# Patient Record
Sex: Female | Born: 1955 | Race: White | Hispanic: No | Smoking: Never smoker
Health system: Southern US, Community
[De-identification: ages and names within clinical notes are randomized; demographics above are authoritative.]

## PROBLEM LIST (undated history)

## (undated) DIAGNOSIS — J45909 Unspecified asthma, uncomplicated: Secondary | ICD-10-CM

## (undated) DIAGNOSIS — K219 Gastro-esophageal reflux disease without esophagitis: Secondary | ICD-10-CM

## (undated) DIAGNOSIS — Z8489 Family history of other specified conditions: Secondary | ICD-10-CM

## (undated) DIAGNOSIS — I499 Cardiac arrhythmia, unspecified: Secondary | ICD-10-CM

## (undated) DIAGNOSIS — J302 Other seasonal allergic rhinitis: Secondary | ICD-10-CM

## (undated) DIAGNOSIS — M1990 Unspecified osteoarthritis, unspecified site: Secondary | ICD-10-CM

## (undated) DIAGNOSIS — R011 Cardiac murmur, unspecified: Secondary | ICD-10-CM

## (undated) DIAGNOSIS — E041 Nontoxic single thyroid nodule: Secondary | ICD-10-CM

## (undated) DIAGNOSIS — R202 Paresthesia of skin: Secondary | ICD-10-CM

## (undated) DIAGNOSIS — I10 Essential (primary) hypertension: Secondary | ICD-10-CM

## (undated) HISTORY — PX: MOUTH SURGERY: SHX715

## (undated) HISTORY — PX: TONSILLECTOMY: SUR1361

## (undated) HISTORY — PX: COLONOSCOPY: SHX174

## (undated) HISTORY — PX: PARATHYROIDECTOMY: SHX19

---

## 2011-05-10 DIAGNOSIS — M189 Osteoarthritis of first carpometacarpal joint, unspecified: Secondary | ICD-10-CM | POA: Insufficient documentation

## 2013-03-12 DIAGNOSIS — M170 Bilateral primary osteoarthritis of knee: Secondary | ICD-10-CM | POA: Insufficient documentation

## 2013-05-15 DIAGNOSIS — M222X9 Patellofemoral disorders, unspecified knee: Secondary | ICD-10-CM | POA: Insufficient documentation

## 2014-05-27 DIAGNOSIS — E041 Nontoxic single thyroid nodule: Secondary | ICD-10-CM | POA: Insufficient documentation

## 2018-09-18 ENCOUNTER — Inpatient Hospital Stay: Admission: RE | Admit: 2018-09-18 | Payer: Self-pay | Source: Ambulatory Visit

## 2018-09-23 ENCOUNTER — Other Ambulatory Visit: Payer: Self-pay

## 2018-09-23 NOTE — H&P (Signed)
Barbara Harvey is a 63 y.o. female hereFx D+C , H/S and myosure for PMB and a endometrial polyp  embx shows proliferative , no atypia , no cancer   Pap - negative    Past Medical History:  has a past medical history of Abnormal Pap smear (2005), Acne (1970?), Actinic keratosis (early 1990s), Allergic rhinitis, seasonal (03/08/2011), Allergic state (?), Anxiety, Arthritis (?), Atypical nevus (early 1990s), Dermatitis (March 2017), Environmental allergies (2000), Parathyroid disorder (CMS-HCC) (2018), Right thyroid nodule (05/27/2014), Urticaria, chronic (1977), and Varicella (~ age 31).  Past Surgical History:  has a past surgical history that includes Tonsillectomy; oral cyst (Right); surveillance colonoscopy (N/A, 06/24/2012); parathyroidectomy (N/A, 05/09/2016); monitoring cranial nerves unilateral (N/A, 05/09/2016); Colonoscopy (06/24/2012, 01/20/2007); Colonoscopy (09/02/2017); and Skin biopsy (?early 1990s). Family History: family history includes Basal cell carcinoma in her father and mother; Brain hemorrhage in her paternal grandmother; Breast cancer (age of onset: 67) in her paternal grandmother; Cancer in her father, maternal aunt, maternal grandfather, maternal grandmother, mother, and paternal grandmother; Colon cancer in her maternal grandfather; Colon polyps in her maternal uncle; Coronary Artery Disease (Blocked arteries around heart) in her paternal grandfather; Diabetes in her father; Diabetes type II in her father; High blood pressure (Hypertension) in her father, mother, and paternal grandmother; High blood pressure (Hypertension) (age of onset: 7) in her paternal grandfather; Hyperlipidemia (Elevated cholesterol) in her father and mother; Lymphoma in her maternal grandmother; Melanoma in her maternal aunt; Multiple myeloma in her maternal grandfather; Nephrolithiasis in her father; Obesity in her maternal grandmother and paternal grandmother; Osteoarthritis in her father, paternal grandfather, and  paternal grandmother; Other in her father; Prostate cancer in her father; Rashes / Skin problems in her father and mother; Skin cancer in her father, maternal aunt, and mother; Skin cancer, non-melanoma in her father and mother; Stroke in her paternal grandfather; Stroke (age of onset: 60) in her mother. Social History:  reports that she has never smoked. She has never used smokeless tobacco. She reports previous alcohol use. She reports that she does not use drugs. OB/GYN History:          OB History    Gravida  2   Para  2   Term  2   Preterm      AB      Living  2     SAB      TAB      Ectopic      Molar      Multiple      Live Births  2          Allergies: is allergic to pineapple. Medications:  Current Outpatient Medications:  .  acetaminophen (TYLENOL) 500 MG tablet, Take 1,000 mg by mouth nightly  , Disp: , Rfl:  .  calcium citrate-vitamin D3 (CITRACAL+D) 315-200 mg-unit tablet, Take 1 tablet by mouth 2 (two) times daily with meals.  , Disp: , Rfl:  .  fexofenadine (ALLEGRA) 180 MG tablet, 1 tab by mouth daily, Disp: , Rfl: 1 .  fluticasone propionate (FLONASE) 50 mcg/actuation nasal spray, Place 2 sprays into both nostrils once daily, Disp: 48 g, Rfl: 3 .  magnesium oxide (PHILLIPS) 500 mg Tab, Take by mouth 2 (two) times daily, Disp: , Rfl:   Review of Systems: General:                      No fatigue or weight loss Eyes:  No vision changes Ears:                            No hearing difficulty Respiratory:                No cough or shortness of breath Pulmonary:                  No asthma or shortness of breath Cardiovascular:           No chest pain, palpitations, dyspnea on exertion Gastrointestinal:          No abdominal bloating, chronic diarrhea, constipations, masses, pain or hematochezia Genitourinary:             No hematuria, dysuria, abnormal vaginal discharge, pelvic pain, Menometrorrhagia Lymphatic:                    No swollen lymph nodes Musculoskeletal:         No muscle weakness Neurologic:                  No extremity weakness, syncope, seizure disorder Psychiatric:                  No history of depression, delusions or suicidal/homicidal ideation    Exam:      Vitals:   09/04/18 1553  BP: 130/88  Pulse: 86    Body mass index is 33.11 kg/m.  WDWN white/female in NAD   Lungs: CTA  CV : RRR without murmur   Breast: exam done in sitting and lying position : No dimpling or retraction, no dominant mass, no spontaneous discharge, no axillary adenopathy Neck:  no thyromegaly Abdomen: soft , no mass, normal active bowel sounds,  non-tender, no rebound tenderness Pelvic: tanner stage 5 ,  External genitalia: vulva /labia no lesions Urethra: no prolapse Vagina: normal physiologic d/c Cervix: no lesions, no cervical motion tenderness   Uterus: normal size shape and contour, non-tender Adnexa: no mass,  non-tender    Saline infusion sonohysterography: betadine prep to the cervix followed by placement of the HSG catheter into the endometrial canal . Sterile H2O is injected while performing a transvaginal u/s . Findings: Saline u/s   Uterus retroverted  Fibroid seen: Lt fundal=2.1cm  Endometrium=11.88m  No free fluid seen  B/L ovaries appear wnl  Possible endometrial polyp seen: 1.90 x 0.78 x 0.97cm   *Performed & reviewed by TJS,MD  Impression:   The primary encounter diagnosis was PMB (postmenopausal bleeding). A diagnosis of Endometrial polyp was also pertinent to this visit.    Plan:  Reviewed the u/s findings with the pt . My recommendation is a fractional D+C  , hysteroscopy and myosure resection of polyp  In OR  She agrees and will be set up   all questions answered     .  TCaroline Sauger MD       Electronically signed by , TBurman Blacksmith MD

## 2018-09-25 ENCOUNTER — Other Ambulatory Visit: Payer: Self-pay

## 2018-09-25 ENCOUNTER — Encounter
Admission: RE | Admit: 2018-09-25 | Discharge: 2018-09-25 | Disposition: A | Discharge status: Home / Self Care | Payer: BC Managed Care – PPO | Source: Ambulatory Visit | Attending: Obstetrics and Gynecology | Admitting: Obstetrics and Gynecology

## 2018-09-25 DIAGNOSIS — Z01812 Encounter for preprocedural laboratory examination: Secondary | ICD-10-CM | POA: Insufficient documentation

## 2018-09-25 HISTORY — DX: Gastro-esophageal reflux disease without esophagitis: K21.9

## 2018-09-25 HISTORY — DX: Nontoxic single thyroid nodule: E04.1

## 2018-09-25 HISTORY — DX: Family history of other specified conditions: Z84.89

## 2018-09-25 HISTORY — DX: Cardiac murmur, unspecified: R01.1

## 2018-09-25 HISTORY — DX: Unspecified osteoarthritis, unspecified site: M19.90

## 2018-09-25 LAB — BASIC METABOLIC PANEL
Anion gap: 9 (ref 5–15)
BUN: 19 mg/dL (ref 8–23)
CO2: 24 mmol/L (ref 22–32)
Calcium: 9.3 mg/dL (ref 8.9–10.3)
Chloride: 104 mmol/L (ref 98–111)
Creatinine, Ser: 0.81 mg/dL (ref 0.44–1.00)
GFR calc Af Amer: >60 mL/min (ref >60)
GFR calc non Af Amer: >60 mL/min (ref >60)
Glucose, Bld: 86 mg/dL (ref 70–99)
Potassium: 3.5 mmol/L (ref 3.5–5.1)
Sodium: 137 mmol/L (ref 135–145)

## 2018-09-25 LAB — CBC
HCT: 42.5 % (ref 36.0–46.0)
Hemoglobin: 13.8 g/dL (ref 12.0–15.0)
MCH: 29.7 pg (ref 26.0–34.0)
MCHC: 32.5 g/dL (ref 30.0–36.0)
MCV: 91.6 fL (ref 80.0–100.0)
Platelets: 324 10*3/uL (ref 150–400)
RBC: 4.64 MIL/uL (ref 3.87–5.11)
RDW: 13.2 % (ref 11.5–15.5)
WBC: 6.6 10*3/uL (ref 4.0–10.5)
nRBC: 0 % (ref 0.0–0.2)

## 2018-09-25 LAB — TYPE AND SCREEN
ABO/RH(D): A NEG
Antibody Screen: NEGATIVE

## 2018-09-25 NOTE — Patient Instructions (Signed)
Your procedure is scheduled on: 10-03-18 THURSDAY Report to Same Day Surgery 2nd floor medical mall [facility] Entrance-take elevator on left to 2nd floor.  Check in with surgery information desk.) To find out your arrival time please call [phone] between 1PM - 3PM on 10-02-18 [facility]  Remember: Instructions that are not followed completely may result in serious medical risk, up to and including death, or upon the discretion of your surgeon and anesthesiologist your surgery may need to be rescheduled.    _x___ 1. Do not eat food after midnight the night before your procedure. NO GUM OR CANDY AFTER MIDNIGHT. You may drink clear liquids up to 2 hours before you are scheduled to arrive at the hospital for your procedure.  Do not drink clear liquids within 2 hours of your scheduled arrival to the hospital.  Clear liquids include  --Water or Apple juice without pulp  --Clear carbohydrate beverage such as ClearFast or Gatorade  --Black Coffee or Clear Tea (No milk, no creamers, do not add anything to the coffee or Tea   ____Ensure clear carbohydrate drink on the way to the hospital for bariatric patients  _X___Ensure clear carbohydrate drink 3 hours before surgery.     __x__ 2. No Alcohol for 24 hours before or after surgery.   __x__3. No Smoking or e-cigarettes for 24 prior to surgery.  Do not use any chewable tobacco products for at least 6 hour prior to surgery   ____  4. Bring all medications with you on the day of surgery if instructed.    __x__ 5. Notify your doctor if there is any change in your medical condition     (cold, fever, infections).    x___6. On the morning of surgery brush your teeth with toothpaste and water.  You may rinse your mouth with mouth wash if you wish.  Do not swallow any toothpaste or mouthwash.   Do not wear jewelry, make-up, hairpins, clips or nail polish.  Do not wear lotions, powders, or perfumes. You may wear deodorant.  Do not shave 48 hours  prior to surgery. Men may shave face and neck.  Do not bring valuables to the hospital.    [facility] is not responsible for any belongings or valuables.               Contacts, dentures or bridgework may not be worn into surgery.  Leave your suitcase in the car. After surgery it may be brought to your room.  For patients admitted to the hospital, discharge time is determined by your  treatment team.  _  Patients discharged the day of surgery will not be allowed to drive home.  You will need someone to drive you home and stay with you the night of your procedure.    Please read over the following fact sheets that you were given:   [facility] Preparing for Surgery and or MRSA Information   _x___ TAKE THE FOLLOWING MEDICATION THE MORNING OF SURGERY WITH A SMALL SIP OF WATER. These include:  1. ALLEGRA (FEXOFENADINE)  2. MAGNESIUM OXIDE  3.  4.  5.  6.  ____Fleets enema or Magnesium Citrate as directed.   ____ Use CHG Soap or sage wipes as directed on instruction sheet   _X___ Bring ALBUTEROL INHALER to hospital day of surgery   ____ Stop Metformin and Janumet 2 days prior to surgery.    ____ Take 1/2 of usual insulin dose the night before surgery and none on the  morning surgery.   ____ Follow recommendations from Cardiologist, Pulmonologist or PCP regarding stopping Aspirin, Coumadin, Plavix ,Eliquis, Effient, or Pradaxa, and Pletal.  X____Stop Anti-inflammatories such as Advil, Aleve, Ibuprofen, Motrin, Naproxen, Naprosyn, Goodies powders or aspirin products NOW-OK to take Tylenol    ____ Stop supplements until after surgery.    ____ Bring C-Pap to the hospital.

## 2018-09-30 ENCOUNTER — Other Ambulatory Visit
Admission: RE | Admit: 2018-09-30 | Discharge: 2018-09-30 | Disposition: A | Discharge status: Home / Self Care | Payer: BC Managed Care – PPO | Source: Ambulatory Visit | Attending: Obstetrics and Gynecology | Admitting: Obstetrics and Gynecology

## 2018-09-30 ENCOUNTER — Other Ambulatory Visit: Payer: Self-pay

## 2018-09-30 DIAGNOSIS — N840 Polyp of corpus uteri: Secondary | ICD-10-CM | POA: Insufficient documentation

## 2018-09-30 DIAGNOSIS — N950 Postmenopausal bleeding: Secondary | ICD-10-CM | POA: Diagnosis not present

## 2018-09-30 DIAGNOSIS — Z01812 Encounter for preprocedural laboratory examination: Secondary | ICD-10-CM | POA: Insufficient documentation

## 2018-09-30 DIAGNOSIS — Z20828 Contact with and (suspected) exposure to other viral communicable diseases: Secondary | ICD-10-CM | POA: Insufficient documentation

## 2018-09-30 LAB — SARS CORONAVIRUS 2 (TAT 6-24 HRS): SARS Coronavirus 2: NEGATIVE

## 2018-10-03 ENCOUNTER — Ambulatory Visit: Payer: BC Managed Care – PPO | Admitting: Anesthesiology

## 2018-10-03 ENCOUNTER — Ambulatory Visit
Admission: RE | Admit: 2018-10-03 | Discharge: 2018-10-03 | Disposition: A | Discharge status: Home / Self Care | Payer: BC Managed Care – PPO | Source: Ambulatory Visit | Attending: Obstetrics and Gynecology | Admitting: Obstetrics and Gynecology

## 2018-10-03 ENCOUNTER — Encounter
Admission: RE | Disposition: A | Discharge status: Home / Self Care | Payer: Self-pay | Source: Ambulatory Visit | Attending: Obstetrics and Gynecology

## 2018-10-03 ENCOUNTER — Encounter: Payer: Self-pay | Admitting: *Deleted

## 2018-10-03 ENCOUNTER — Other Ambulatory Visit: Payer: Self-pay

## 2018-10-03 DIAGNOSIS — N840 Polyp of corpus uteri: Secondary | ICD-10-CM | POA: Insufficient documentation

## 2018-10-03 DIAGNOSIS — M1990 Unspecified osteoarthritis, unspecified site: Secondary | ICD-10-CM | POA: Diagnosis not present

## 2018-10-03 DIAGNOSIS — N950 Postmenopausal bleeding: Secondary | ICD-10-CM | POA: Insufficient documentation

## 2018-10-03 HISTORY — PX: DILATATION & CURETTAGE/HYSTEROSCOPY WITH MYOSURE: SHX6511

## 2018-10-03 LAB — ABO/RH: ABO/RH(D): A NEG

## 2018-10-03 SURGERY — DILATATION & CURETTAGE/HYSTEROSCOPY WITH MYOSURE
Anesthesia: General | Site: Uterus

## 2018-10-03 MED ORDER — MIDAZOLAM HCL 2 MG/2ML IJ SOLN
INTRAMUSCULAR | Status: DC | PRN
  Administered 2018-10-03: 2 mg via INTRAVENOUS

## 2018-10-03 MED ORDER — DEXAMETHASONE SODIUM PHOSPHATE 10 MG/ML IJ SOLN
INTRAMUSCULAR | Status: AC
  Filled 2018-10-03: qty 1

## 2018-10-03 MED ORDER — SILVER NITRATE-POT NITRATE 75-25 % EX MISC
CUTANEOUS | Status: AC
  Filled 2018-10-03: qty 1

## 2018-10-03 MED ORDER — ONDANSETRON HCL 4 MG/2ML IJ SOLN: 4.0000 mg | Freq: Once | INTRAMUSCULAR | Status: DC | PRN

## 2018-10-03 MED ORDER — LACTATED RINGERS IV SOLN
INTRAVENOUS | Status: DC
  Administered 2018-10-03: 14:00:00 via INTRAVENOUS

## 2018-10-03 MED ORDER — FENTANYL CITRATE (PF) 100 MCG/2ML IJ SOLN
INTRAMUSCULAR | Status: AC
  Filled 2018-10-03: qty 2

## 2018-10-03 MED ORDER — FAMOTIDINE 20 MG PO TABS
ORAL_TABLET | ORAL | Status: AC
  Administered 2018-10-03: 14:00:00 20 mg via ORAL
  Filled 2018-10-03: qty 1

## 2018-10-03 MED ORDER — LACTATED RINGERS IV SOLN: INTRAVENOUS | Status: DC

## 2018-10-03 MED ORDER — MIDAZOLAM HCL 2 MG/2ML IJ SOLN
INTRAMUSCULAR | Status: AC
  Filled 2018-10-03: qty 2

## 2018-10-03 MED ORDER — DEXAMETHASONE SODIUM PHOSPHATE 10 MG/ML IJ SOLN
INTRAMUSCULAR | Status: DC | PRN
  Administered 2018-10-03: 8 mg via INTRAVENOUS

## 2018-10-03 MED ORDER — CEFAZOLIN SODIUM-DEXTROSE 2-4 GM/100ML-% IV SOLN
2.0000 g | Freq: Once | INTRAVENOUS | Status: AC
  Administered 2018-10-03: 2 g via INTRAVENOUS

## 2018-10-03 MED ORDER — ONDANSETRON HCL 4 MG/2ML IJ SOLN
INTRAMUSCULAR | Status: AC
  Filled 2018-10-03: qty 2

## 2018-10-03 MED ORDER — SILVER NITRATE-POT NITRATE 75-25 % EX MISC
CUTANEOUS | Status: DC | PRN
  Administered 2018-10-03: 2

## 2018-10-03 MED ORDER — FENTANYL CITRATE (PF) 100 MCG/2ML IJ SOLN
INTRAMUSCULAR | Status: DC | PRN
  Administered 2018-10-03: 50 ug via INTRAVENOUS
  Administered 2018-10-03 (×2): 25 ug via INTRAVENOUS

## 2018-10-03 MED ORDER — FAMOTIDINE 20 MG PO TABS
20.0000 mg | ORAL_TABLET | Freq: Once | ORAL | Status: AC
  Administered 2018-10-03: 14:00:00 20 mg via ORAL

## 2018-10-03 MED ORDER — CEFAZOLIN SODIUM-DEXTROSE 2-4 GM/100ML-% IV SOLN
INTRAVENOUS | Status: AC
  Filled 2018-10-03: qty 100

## 2018-10-03 MED ORDER — FENTANYL CITRATE (PF) 100 MCG/2ML IJ SOLN
25.0000 ug | INTRAMUSCULAR | Status: DC | PRN
  Administered 2018-10-03: 16:00:00 25 ug via INTRAVENOUS

## 2018-10-03 MED ORDER — PROPOFOL 10 MG/ML IV BOLUS
INTRAVENOUS | Status: DC | PRN
  Administered 2018-10-03: 150 mg via INTRAVENOUS

## 2018-10-03 MED ORDER — ONDANSETRON HCL 4 MG/2ML IJ SOLN
INTRAMUSCULAR | Status: DC | PRN
  Administered 2018-10-03: 4 mg via INTRAVENOUS

## 2018-10-03 MED ORDER — LIDOCAINE HCL (CARDIAC) PF 100 MG/5ML IV SOSY
PREFILLED_SYRINGE | INTRAVENOUS | Status: DC | PRN
  Administered 2018-10-03: 100 mg via INTRAVENOUS

## 2018-10-03 SURGICAL SUPPLY — 20 items
CANISTER SUCT 3000ML PPV (MISCELLANEOUS) ×3 IMPLANT
CATH ROBINSON RED A/P 16FR (CATHETERS) ×2 IMPLANT
COVER WAND RF STERILE (DRAPES) IMPLANT
DEVICE MYOSURE LITE (MISCELLANEOUS) IMPLANT
DEVICE MYOSURE REACH (MISCELLANEOUS) ×2 IMPLANT
GLOVE BIO SURGEON STRL SZ8 (GLOVE) ×3 IMPLANT
GOWN STRL REUS W/ TWL LRG LVL3 (GOWN DISPOSABLE) ×1 IMPLANT
GOWN STRL REUS W/ TWL XL LVL3 (GOWN DISPOSABLE) ×1 IMPLANT
GOWN STRL REUS W/TWL LRG LVL3 (GOWN DISPOSABLE) ×2
GOWN STRL REUS W/TWL XL LVL3 (GOWN DISPOSABLE) ×2
KIT PROCEDURE FLUENT (KITS) ×3 IMPLANT
KIT TURNOVER CYSTO (KITS) ×3 IMPLANT
PACK DNC HYST (MISCELLANEOUS) ×2 IMPLANT
PAD OB MATERNITY 4.3X12.25 (PERSONAL CARE ITEMS) ×3 IMPLANT
PAD PREP 24X41 OB/GYN DISP (PERSONAL CARE ITEMS) ×3 IMPLANT
SOL .9 NS 3000ML IRR  AL (IV SOLUTION) ×2
SOL .9 NS 3000ML IRR UROMATIC (IV SOLUTION) ×1 IMPLANT
TOWEL OR 17X26 4PK STRL BLUE (TOWEL DISPOSABLE) ×3 IMPLANT
TUBING CONNECTING 10 (TUBING) ×1 IMPLANT
TUBING CONNECTING 10' (TUBING) ×1

## 2018-10-03 NOTE — Anesthesia Procedure Notes (Signed)
Procedure Name: LMA Insertion Date/Time: 10/03/2018 2:58 PM Performed by: Hedda Slade, CRNA Pre-anesthesia Checklist: Patient identified, Patient being monitored, Timeout performed, Emergency Drugs available and Suction available Patient Re-evaluated:Patient Re-evaluated prior to induction Oxygen Delivery Method: Circle system utilized Preoxygenation: Pre-oxygenation with 100% oxygen Induction Type: IV induction Ventilation: Mask ventilation without difficulty LMA: LMA inserted LMA Size: 3.5 Tube type: Oral Number of attempts: 1 Placement Confirmation: positive ETCO2 and breath sounds checked- equal and bilateral Tube secured with: Tape Dental Injury: Teeth and Oropharynx as per pre-operative assessment

## 2018-10-03 NOTE — Brief Op Note (Signed)
10/03/2018  3:40 PM  PATIENT:  Barbara Harvey  63 y.o. female  PRE-OPERATIVE DIAGNOSIS:  postmenopausal bleeding, endometrial polyp  POST-OPERATIVE DIAGNOSIS:  postmenopausal bleeding, endometrial polyp  PROCEDURE:  Procedure(s): FRACTIONAL DILATATION & CURETTAGE/HYSTEROSCOPY WITH MYOSURE RESECTION OF POLYP (N/A)  SURGEON:  Surgeon(s) and Role:    * Holston Oyama, Gwen Her, MD - Primary  PHYSICIAN ASSISTANT:   ASSISTANTS: none   ANESTHESIA:   general  EBL:  2 mL   BLOOD ADMINISTERED:none  DRAINS: none   LOCAL MEDICATIONS USED:  NONE  SPECIMEN:  Source of Specimen:  ecc,endometrial curetting and endometrial polyp  DISPOSITION OF SPECIMEN:  PATHOLOGY  COUNTS:  YES  TOURNIQUET:  * No tourniquets in log *  DICTATION: .Other Dictation: Dictation Number verbal  PLAN OF CARE: Discharge to home after PACU  PATIENT DISPOSITION:  PACU - hemodynamically stable.   Delay start of Pharmacological VTE agent (>24hrs) due to surgical blood loss or risk of bleeding: not applicable

## 2018-10-03 NOTE — Progress Notes (Signed)
Pt here for Fractional dilation and curettage , myosure resection . Labs reviewed . All questions answered  proceed

## 2018-10-03 NOTE — Op Note (Signed)
NAME: Barbara Harvey, Barbara Harvey MEDICAL RECORD [id number] ACCOUNT [id number] DATE OF BIRTH:1955/12/08 FACILITY: [facility] LOCATION: [facility] PHYSICIAN:THOMAS Josefine Class, MD  OPERATIVE REPORT  DATE OF PROCEDURE:  10/03/2018  PREOPERATIVE DIAGNOSES: 1.  Postmenopausal bleeding. 2.  Endometrial polyp.  POSTOPERATIVE DIAGNOSES: 1.  Postmenopausal bleeding. 2.  Endometrial polyp.  PROCEDURE: 1.  Fractional dilation and curettage. 2.  Hysteroscopy. 3.  MyoSure resection of endometrial polyp.  ANESTHESIA:  General endotracheal anesthesia.  SURGEON:  Boykin Nearing, MD  INDICATIONS:  A 63 year old female with postmenopausal bleeding.  The patient was noted to have an endometrial polyp noted on saline infusion sonohysterography which was performed in the office.  Endometrial biopsy was negative for atypia or cancer.  DESCRIPTION OF PROCEDURE:  After adequate general endotracheal anesthesia, the patient was placed in dorsal supine position with the legs in the Anderson stirrups.  The patient's perineum and vagina were prepped and draped in normal sterile fashion.   Timeout was performed.  The patient did receive 2 g IV Ancef prior to commencement of the case.  A weighted speculum was placed in the posterior vaginal vault, and the anterior cervix was grasped with a single-tooth tenaculum.  An endocervical curettage  was performed.  Uterus was then sounded to 9 cm, and cervix was then dilated to #17 Hanks dilator.  Hysteroscope with normal saline as distending medium was advanced into the endometrial cavity showed a 1.5 x 1 cm endometrial polyp located at the right  posterior lateral portion of the endometrial cavity.  The MyoSure LITE apparatus was brought up to the operative field, and the polyp was resected without difficulty.  Polyp was measured at 1.5 x 1.0 cm.  Good hemostasis was noted.  An endometrial  curettage was then performed with scant tissue.  Pictures were taken.  The  procedure was terminated.  Net deficit from normal saline for the MyoSure was 90 mL.  Silver nitrate was used on the tenacula sites to control bleeding.  COMPLICATIONS:  There were no complications.  ESTIMATED BLOOD LOSS:  5 mL.  INTRAOPERATIVE FLUIDS:  400 mL.  URINE OUTPUT:  100 mL via straight catheterization prior to commencement of the case.  DISPOSITION:  The patient was taken to recovery room in good condition.  LN/NUANCE  D:10/03/2018 T:10/03/2018 JOB:[id number]

## 2018-10-03 NOTE — Anesthesia Preprocedure Evaluation (Signed)
Anesthesia Evaluation  Patient identified by MRN, date of birth, ID band Patient awake    Reviewed: Allergy & Precautions, H&P , NPO status , Patient's Chart, lab work & pertinent test results, reviewed documented beta blocker date and time   Airway Mallampati: II  TM Distance: >3 FB Neck ROM: full    Dental  (+) Teeth Intact   Pulmonary neg pulmonary ROS, Patient did not abstain from smoking.,    Pulmonary exam normal        Cardiovascular Exercise Tolerance: Good Normal cardiovascular exam+ Valvular Problems/Murmurs  Rate:Normal     Neuro/Psych negative neurological ROS  negative psych ROS   GI/Hepatic Neg liver ROS, GERD  Medicated,  Endo/Other  negative endocrine ROS  Renal/GU negative Renal ROS  negative genitourinary   Musculoskeletal   Abdominal   Peds  Hematology negative hematology ROS (+)   Anesthesia Other Findings   Reproductive/Obstetrics negative OB ROS                             Anesthesia Physical Anesthesia Plan  ASA: II  Anesthesia Plan: General LMA   Post-op Pain Management:    Induction:   PONV Risk Score and Plan:   Airway Management Planned:   Additional Equipment:   Intra-op Plan:   Post-operative Plan:   Informed Consent: I have reviewed the patients History and Physical, chart, labs and discussed the procedure including the risks, benefits and alternatives for the proposed anesthesia with the patient or authorized representative who has indicated his/her understanding and acceptance.       Plan Discussed with: CRNA  Anesthesia Plan Comments:         Anesthesia Quick Evaluation

## 2018-10-03 NOTE — Anesthesia Post-op Follow-up Note (Signed)
Anesthesia QCDR form completed.        

## 2018-10-03 NOTE — Transfer of Care (Signed)
Immediate Anesthesia Transfer of Care Note  Patient: Barbara Harvey  Procedure(s) Performed: FRACTIONAL DILATATION & CURETTAGE/HYSTEROSCOPY WITH MYOSURE RESECTION OF POLYP (N/A Uterus)  Patient Location: PACU  Anesthesia Type:General  Level of Consciousness: sedated  Airway & Oxygen Therapy: Patient Spontanous Breathing and Patient connected to face mask oxygen  Post-op Assessment: Report given to RN and Post -op Vital signs reviewed and stable  Post vital signs: Reviewed and stable  Last Vitals:  Vitals Value Taken Time  BP 157/88 10/03/18 1550  Temp 36.1 C 10/03/18 1548  Pulse 75 10/03/18 1550  Resp 10 10/03/18 1550  SpO2 100 % 10/03/18 1550  Vitals shown include unvalidated device data.  Last Pain:  Vitals:   10/03/18 1550  TempSrc:   PainSc: 0-No pain         Complications: No apparent anesthesia complications

## 2018-10-03 NOTE — Discharge Instructions (Signed)

## 2018-10-05 ENCOUNTER — Encounter: Payer: Self-pay | Admitting: Obstetrics and Gynecology

## 2018-10-06 NOTE — Anesthesia Postprocedure Evaluation (Signed)
Anesthesia Post Note  Patient: Barbara Harvey  Procedure(s) Performed: FRACTIONAL DILATATION & CURETTAGE/HYSTEROSCOPY WITH MYOSURE RESECTION OF POLYP (N/A Uterus)  Patient location during evaluation: PACU Anesthesia Type: General Level of consciousness: awake and alert Pain management: pain level controlled Vital Signs Assessment: post-procedure vital signs reviewed and stable Respiratory status: spontaneous breathing, nonlabored ventilation, respiratory function stable and patient connected to nasal cannula oxygen Cardiovascular status: blood pressure returned to baseline and stable Postop Assessment: no apparent nausea or vomiting Anesthetic complications: no     Last Vitals:  Vitals:   10/03/18 1646 10/03/18 1703  BP: (!) 148/85 (!) 157/69  Pulse: 78 67  Resp: 16 16  Temp:    SpO2: 100% 98%    Last Pain:  Vitals:   10/06/18 0809  TempSrc:   PainSc: 0-No pain                 Molli Barrows

## 2018-10-07 LAB — SURGICAL PATHOLOGY

## 2018-12-22 DIAGNOSIS — I10 Essential (primary) hypertension: Secondary | ICD-10-CM | POA: Diagnosis present

## 2019-02-21 ENCOUNTER — Inpatient Hospital Stay
Admission: EM | Admit: 2019-02-21 | Discharge: 2019-02-23 | DRG: 446 | Disposition: A | Discharge status: Home / Self Care | Payer: BC Managed Care – PPO | Source: Ambulatory Visit | Attending: Internal Medicine | Admitting: Internal Medicine

## 2019-02-21 ENCOUNTER — Other Ambulatory Visit: Payer: Self-pay

## 2019-02-21 ENCOUNTER — Inpatient Hospital Stay: Payer: BC Managed Care – PPO

## 2019-02-21 ENCOUNTER — Emergency Department: Payer: BC Managed Care – PPO

## 2019-02-21 ENCOUNTER — Encounter: Payer: Self-pay | Admitting: Emergency Medicine

## 2019-02-21 DIAGNOSIS — R1013 Epigastric pain: Secondary | ICD-10-CM | POA: Diagnosis not present

## 2019-02-21 DIAGNOSIS — Z79899 Other long term (current) drug therapy: Secondary | ICD-10-CM

## 2019-02-21 DIAGNOSIS — R748 Abnormal levels of other serum enzymes: Secondary | ICD-10-CM | POA: Diagnosis present

## 2019-02-21 DIAGNOSIS — Z833 Family history of diabetes mellitus: Secondary | ICD-10-CM

## 2019-02-21 DIAGNOSIS — K219 Gastro-esophageal reflux disease without esophagitis: Secondary | ICD-10-CM | POA: Diagnosis present

## 2019-02-21 DIAGNOSIS — K8020 Calculus of gallbladder without cholecystitis without obstruction: Secondary | ICD-10-CM | POA: Diagnosis present

## 2019-02-21 DIAGNOSIS — I10 Essential (primary) hypertension: Secondary | ICD-10-CM | POA: Diagnosis present

## 2019-02-21 DIAGNOSIS — R7989 Other specified abnormal findings of blood chemistry: Secondary | ICD-10-CM | POA: Diagnosis present

## 2019-02-21 DIAGNOSIS — Z8249 Family history of ischemic heart disease and other diseases of the circulatory system: Secondary | ICD-10-CM

## 2019-02-21 DIAGNOSIS — Z20822 Contact with and (suspected) exposure to covid-19: Secondary | ICD-10-CM | POA: Diagnosis present

## 2019-02-21 DIAGNOSIS — R112 Nausea with vomiting, unspecified: Secondary | ICD-10-CM

## 2019-02-21 DIAGNOSIS — Z91018 Allergy to other foods: Secondary | ICD-10-CM

## 2019-02-21 DIAGNOSIS — R945 Abnormal results of liver function studies: Secondary | ICD-10-CM | POA: Diagnosis present

## 2019-02-21 DIAGNOSIS — M1990 Unspecified osteoarthritis, unspecified site: Secondary | ICD-10-CM | POA: Diagnosis present

## 2019-02-21 DIAGNOSIS — K7689 Other specified diseases of liver: Secondary | ICD-10-CM | POA: Diagnosis present

## 2019-02-21 DIAGNOSIS — K8021 Calculus of gallbladder without cholecystitis with obstruction: Secondary | ICD-10-CM

## 2019-02-21 DIAGNOSIS — Z823 Family history of stroke: Secondary | ICD-10-CM | POA: Diagnosis not present

## 2019-02-21 DIAGNOSIS — R109 Unspecified abdominal pain: Secondary | ICD-10-CM | POA: Diagnosis present

## 2019-02-21 DIAGNOSIS — T7840XA Allergy, unspecified, initial encounter: Secondary | ICD-10-CM | POA: Diagnosis present

## 2019-02-21 LAB — COMPREHENSIVE METABOLIC PANEL
ALT: 1600 U/L — ABNORMAL HIGH (ref 0–44)
AST: 2420 U/L — ABNORMAL HIGH (ref 15–41)
Albumin: 4.1 g/dL (ref 3.5–5.0)
Alkaline Phosphatase: 179 U/L — ABNORMAL HIGH (ref 38–126)
Anion gap: 10 (ref 5–15)
BUN: 14 mg/dL (ref 8–23)
CO2: 29 mmol/L (ref 22–32)
Calcium: 9.7 mg/dL (ref 8.9–10.3)
Chloride: 99 mmol/L (ref 98–111)
Creatinine, Ser: 0.79 mg/dL (ref 0.44–1.00)
GFR calc Af Amer: >60 mL/min (ref >60)
GFR calc non Af Amer: >60 mL/min (ref >60)
Glucose, Bld: 108 mg/dL — ABNORMAL HIGH (ref 70–99)
Potassium: 3.7 mmol/L (ref 3.5–5.1)
Sodium: 138 mmol/L (ref 135–145)
Total Bilirubin: 3.1 mg/dL — ABNORMAL HIGH (ref 0.3–1.2)
Total Protein: 7.7 g/dL (ref 6.5–8.1)

## 2019-02-21 LAB — APTT: aPTT: 25 seconds (ref 24–36)

## 2019-02-21 LAB — POC SARS CORONAVIRUS 2 AG: SARS Coronavirus 2 Ag: NEGATIVE

## 2019-02-21 LAB — CBC WITH DIFFERENTIAL/PLATELET
Abs Immature Granulocytes: 0.03 10*3/uL (ref 0.00–0.07)
Basophils Absolute: 0.1 10*3/uL (ref 0.0–0.1)
Basophils Relative: 1 %
Eosinophils Absolute: 0 10*3/uL (ref 0.0–0.5)
Eosinophils Relative: 0 %
HCT: 46.3 % — ABNORMAL HIGH (ref 36.0–46.0)
Hemoglobin: 15 g/dL (ref 12.0–15.0)
Immature Granulocytes: 0 %
Lymphocytes Relative: 15 %
Lymphs Abs: 1.3 10*3/uL (ref 0.7–4.0)
MCH: 29.2 pg (ref 26.0–34.0)
MCHC: 32.4 g/dL (ref 30.0–36.0)
MCV: 90.3 fL (ref 80.0–100.0)
Monocytes Absolute: 0.5 10*3/uL (ref 0.1–1.0)
Monocytes Relative: 6 %
Neutro Abs: 6.6 10*3/uL (ref 1.7–7.7)
Neutrophils Relative %: 78 %
Platelets: 315 10*3/uL (ref 150–400)
RBC: 5.13 MIL/uL — ABNORMAL HIGH (ref 3.87–5.11)
RDW: 13.3 % (ref 11.5–15.5)
WBC: 8.5 10*3/uL (ref 4.0–10.5)
nRBC: 0 % (ref 0.0–0.2)

## 2019-02-21 LAB — PROTIME-INR
INR: 0.9 (ref 0.8–1.2)
Prothrombin Time: 12.1 seconds (ref 11.4–15.2)

## 2019-02-21 LAB — TSH: TSH: 1.216 u[IU]/mL (ref 0.350–4.500)

## 2019-02-21 LAB — CK: Total CK: 35 U/L — ABNORMAL LOW (ref 38–234)

## 2019-02-21 LAB — LIPASE, BLOOD: Lipase: 56 U/L — ABNORMAL HIGH (ref 11–51)

## 2019-02-21 LAB — ACETAMINOPHEN LEVEL: Acetaminophen (Tylenol), Serum: <10 ug/mL — ABNORMAL LOW (ref 10–30)

## 2019-02-21 MED ORDER — LORAZEPAM 2 MG/ML IJ SOLN
1.0000 mg | Freq: Once | INTRAMUSCULAR | Status: AC
  Administered 2019-02-21: 1 mg via INTRAVENOUS
  Filled 2019-02-21: qty 1

## 2019-02-21 MED ORDER — MORPHINE SULFATE (PF) 4 MG/ML IV SOLN: 4.0000 mg | INTRAVENOUS | Status: DC | PRN

## 2019-02-21 MED ORDER — SODIUM CHLORIDE 0.9 % IV SOLN: INTRAVENOUS | Status: DC

## 2019-02-21 MED ORDER — AMLODIPINE BESYLATE 5 MG PO TABS
5.0000 mg | ORAL_TABLET | Freq: Every day | ORAL | Status: DC
  Administered 2019-02-22 – 2019-02-23 (×2): 5 mg via ORAL
  Filled 2019-02-21 (×2): qty 1

## 2019-02-21 MED ORDER — MORPHINE SULFATE (PF) 2 MG/ML IV SOLN
2.0000 mg | INTRAVENOUS | Status: DC | PRN
  Administered 2019-02-22: 02:00:00 2 mg via INTRAVENOUS
  Filled 2019-02-21: qty 1

## 2019-02-21 MED ORDER — MONTELUKAST SODIUM 10 MG PO TABS
10.0000 mg | ORAL_TABLET | Freq: Every day | ORAL | Status: DC
  Administered 2019-02-22 – 2019-02-23 (×2): 10 mg via ORAL
  Filled 2019-02-21 (×2): qty 1

## 2019-02-21 MED ORDER — MAGNESIUM OXIDE 400 (241.3 MG) MG PO TABS
400.0000 mg | ORAL_TABLET | Freq: Two times a day (BID) | ORAL | Status: DC
  Administered 2019-02-21 – 2019-02-23 (×4): 400 mg via ORAL
  Filled 2019-02-21 (×4): qty 1

## 2019-02-21 MED ORDER — HYDRALAZINE HCL 25 MG PO TABS: 25.0000 mg | ORAL_TABLET | Freq: Three times a day (TID) | ORAL | Status: DC | PRN

## 2019-02-21 MED ORDER — ONDANSETRON HCL 4 MG/2ML IJ SOLN: 4.0000 mg | Freq: Three times a day (TID) | INTRAMUSCULAR | Status: DC | PRN

## 2019-02-21 MED ORDER — ALBUTEROL SULFATE (2.5 MG/3ML) 0.083% IN NEBU: 2.5000 mg | INHALATION_SOLUTION | RESPIRATORY_TRACT | Status: DC | PRN

## 2019-02-21 MED ORDER — SODIUM CHLORIDE 0.9 % IV BOLUS
500.0000 mL | Freq: Once | INTRAVENOUS | Status: AC
  Administered 2019-02-21: 17:00:00 500 mL via INTRAVENOUS

## 2019-02-21 MED ORDER — LORATADINE 10 MG PO TABS
10.0000 mg | ORAL_TABLET | Freq: Every day | ORAL | Status: DC
  Administered 2019-02-22 – 2019-02-23 (×2): 10 mg via ORAL
  Filled 2019-02-21 (×2): qty 1

## 2019-02-21 MED ORDER — FLUTICASONE PROPIONATE 50 MCG/ACT NA SUSP
2.0000 | NASAL | Status: DC
  Filled 2019-02-21 (×3): qty 16

## 2019-02-21 MED ORDER — ONDANSETRON HCL 4 MG/2ML IJ SOLN: 4.0000 mg | Freq: Once | INTRAMUSCULAR | Status: DC

## 2019-02-21 MED ORDER — SODIUM CHLORIDE 0.9 % IV SOLN: Freq: Once | INTRAVENOUS | Status: DC

## 2019-02-21 MED ORDER — SENNOSIDES-DOCUSATE SODIUM 8.6-50 MG PO TABS
1.0000 | ORAL_TABLET | Freq: Every evening | ORAL | Status: DC | PRN
  Administered 2019-02-23: 10:00:00 1 via ORAL
  Filled 2019-02-21: qty 1

## 2019-02-21 NOTE — ED Notes (Signed)
Pt assisted to toilet 

## 2019-02-21 NOTE — ED Provider Notes (Signed)
[facility] Emergency Department Provider Note    First MD Initiated Contact with Patient 02/21/19 1419     (approximate)  I have reviewed the triage vital signs and the nursing notes.   HISTORY  Chief Complaint Abdominal Pain    HPI Barbara Harvey is a 64 y.o. female below listed past medical history presents the ER for 2 to 3 days of intermittent  epigastric pain.  Describes it as a bloating sensation and does have associated nausea.  No measured fevers.  No new medications.  Does not drink alcohol.  Has had occasional Tylenol use but has only taken 1 or 2 pills per night.  Denies any history of gallbladder issues kidney issues or liver issues.  Denies any chest pain or shortness of breath.   Past Medical History:  Diagnosis Date  . Arthritis   . Family history of adverse reaction to anesthesia    dad-n/v   . GERD (gastroesophageal reflux disease)    occ-gas x  . Heart murmur    asymptomatic  . Thyroid nodule    No family history on file. Past Surgical History:  Procedure Laterality Date  . COLONOSCOPY     x3  . DILATATION & CURETTAGE/HYSTEROSCOPY WITH MYOSURE N/A 10/03/2018   Procedure: FRACTIONAL DILATATION & CURETTAGE/HYSTEROSCOPY WITH MYOSURE RESECTION OF POLYP;  Surgeon: Schermerhorn, Ihor Austin, MD;  Location: [facility] ORS;  Service: Gynecology;  Laterality: N/A;  . MOUTH SURGERY    . PARATHYROIDECTOMY    . TONSILLECTOMY     age 88   There are no problems to display for this patient.     Prior to Admission medications   Medication Sig Start Date End Date Taking? Authorizing Provider  acetaminophen (TYLENOL) 500 MG tablet Take 1,000 mg by mouth every 8 (eight) hours as needed (pain).    [provider]  albuterol (VENTOLIN HFA) 108 (90 Base) MCG/ACT inhaler Inhale 1-2 puffs into the lungs every 4 (four) hours as needed for wheezing or shortness of breath (PT DOES NOT HAVE ASTHMA OR COPD-ONLY HAD THIS FOR WHEEZING IN 2019 DUE TO  SEASONAL ALLERGIES).     [provider]  Calcium Citrate-Vitamin D (CITRACAL + D PO) Take 1 tablet by mouth 2 (two) times daily.    [provider]  fexofenadine (ALLEGRA) 180 MG tablet Take 180 mg by mouth every morning.     [provider]  fluticasone (FLONASE) 50 MCG/ACT nasal spray Place 2 sprays into both nostrils every morning.     [provider]  Magnesium Oxide 500 MG (LAX) TABS Take 500 mg by mouth 2 (two) times daily. Vear Clock    [provider]  progesterone (PROMETRIUM) 200 MG capsule Take 200 mg by mouth at bedtime.     [provider]    Allergies Pineapple    Social History Social History   Tobacco Use  . Smoking status: Never Smoker  . Smokeless tobacco: Never Used  Substance Use Topics  . Alcohol use: Yes    Comment: rare   . Drug use: Never    Review of Systems Patient denies headaches, rhinorrhea, blurry vision, numbness, shortness of breath, chest pain, edema, cough, abdominal pain, nausea, vomiting, diarrhea, dysuria, fevers, rashes or hallucinations unless otherwise stated above in HPI. ____________________________________________   PHYSICAL EXAM:  VITAL SIGNS: Vitals:   02/21/19 1232  BP: (!) 150/93  Pulse: 88  Resp: 16  Temp: 98.1 F (36.7 C)  SpO2: 98%    Constitutional:  Alert and oriented.  Eyes: Conjunctivae are normal.  Head: Atraumatic. Nose: No congestion/rhinnorhea. Mouth/Throat: Mucous membranes are moist.   Neck: No stridor. Painless ROM.  Cardiovascular: Normal rate, regular rhythm. Grossly normal heart sounds.  Good peripheral circulation. Respiratory: Normal respiratory effort.  No retractions. Lungs CTAB. Gastrointestinal: Soft and nontender. No distention. No abdominal bruits. No CVA tenderness. Genitourinary:  Musculoskeletal: No lower extremity tenderness nor edema.  No joint effusions. Neurologic:  Normal speech and language. No gross focal neurologic deficits are  appreciated. No facial droop Skin:  Skin is warm, dry and intact. No rash noted. Psychiatric: Mood and affect are normal. Speech and behavior are normal.  ____________________________________________   LABS (all labs ordered are listed, but only abnormal results are displayed)  Results for orders placed or performed during the hospital encounter of 02/21/19 (from the past 24 hour(s))  CBC with Differential     Status: Abnormal   Collection Time: 02/21/19 12:53 PM  Result Value Ref Range   WBC 8.5 4.0 - 10.5 K/uL   RBC 5.13 (H) 3.87 - 5.11 MIL/uL   Hemoglobin 15.0 12.0 - 15.0 g/dL   HCT 27.7 (H) 41.2 - 87.8 %   MCV 90.3 80.0 - 100.0 fL   MCH 29.2 26.0 - 34.0 pg   MCHC 32.4 30.0 - 36.0 g/dL   RDW 67.6 72.0 - 94.7 %   Platelets 315 150 - 400 K/uL   nRBC 0.0 0.0 - 0.2 %   Neutrophils Relative % 78 %   Neutro Abs 6.6 1.7 - 7.7 K/uL   Lymphocytes Relative 15 %   Lymphs Abs 1.3 0.7 - 4.0 K/uL   Monocytes Relative 6 %   Monocytes Absolute 0.5 0.1 - 1.0 K/uL   Eosinophils Relative 0 %   Eosinophils Absolute 0.0 0.0 - 0.5 K/uL   Basophils Relative 1 %   Basophils Absolute 0.1 0.0 - 0.1 K/uL   Immature Granulocytes 0 %   Abs Immature Granulocytes 0.03 0.00 - 0.07 K/uL  Comprehensive metabolic panel     Status: Abnormal   Collection Time: 02/21/19 12:53 PM  Result Value Ref Range   Sodium 138 135 - 145 mmol/L   Potassium 3.7 3.5 - 5.1 mmol/L   Chloride 99 98 - 111 mmol/L   CO2 29 22 - 32 mmol/L   Glucose, Bld 108 (H) 70 - 99 mg/dL   BUN 14 8 - 23 mg/dL   Creatinine, Ser 0.96 0.44 - 1.00 mg/dL   Calcium 9.7 8.9 - 28.3 mg/dL   Total Protein 7.7 6.5 - 8.1 g/dL   Albumin 4.1 3.5 - 5.0 g/dL   AST 6,629 (H) 15 - 41 U/L   ALT 1,600 (H) 0 - 44 U/L   Alkaline Phosphatase 179 (H) 38 - 126 U/L   Total Bilirubin 3.1 (H) 0.3 - 1.2 mg/dL   GFR calc non Af Amer >60 >60 mL/min   GFR calc Af Amer >60 >60 mL/min   Anion gap 10 5 - 15  Lipase, blood     Status: Abnormal   Collection Time:  02/21/19 12:53 PM  Result Value Ref Range   Lipase 56 (H) 11 - 51 U/L  Protime-INR     Status: None   Collection Time: 02/21/19 12:53 PM  Result Value Ref Range   Prothrombin Time 12.1 11.4 - 15.2 seconds   INR 0.9 0.8 - 1.2   ____________________________________________ ____________________________________________  RADIOLOGY  I personally reviewed all radiographic images ordered to evaluate for the above  acute complaints and reviewed radiology reports and findings.  These findings were personally discussed with the patient.  Please see medical record for radiology report.  ____________________________________________   PROCEDURES  Procedure(s) performed:  Procedures    Critical Care performed: no ____________________________________________   INITIAL IMPRESSION / ASSESSMENT AND PLAN / ED COURSE  Pertinent labs & imaging results that were available during my care of the patient were reviewed by me and considered in my medical decision making (see chart for details).   DDX: hepatitis, choledocholithisis, cholecystitits, pancreatitis, toxic ingestion, viral illness, medication effect  FAIGE SEELY is a 64 y.o. who presents to the ED with symptoms as described above.  Patient with evidence of acute elevated liver function test.  Mildly elevated bilirubin.  Lipase only equivocally elevated.  Ultrasound ordered to evaluate for obstructive pattern does not show any evidence of common bile duct obstruction but does have cholelithiasis.  Discussed case in consultation with Dr. Vicente Males who is recommended MRCP.  Will discuss with hospitalist for admission.  Have discussed with the patient and available family all diagnostics and treatments performed thus far and all questions were answered to the best of my ability. The patient demonstrates understanding and agreement with plan.      The patient was evaluated in Emergency Department today for the symptoms described in the history of  present illness. He/she was evaluated in the context of the global COVID-19 pandemic, which necessitated consideration that the patient might be at risk for infection with the SARS-CoV-2 virus that causes COVID-19. Institutional protocols and algorithms that pertain to the evaluation of patients at risk for COVID-19 are in a state of rapid change based on information released by regulatory bodies including the CDC and federal and state organizations. These policies and algorithms were followed during the patient's care in the ED.  As part of my medical decision making, I reviewed the following data within the [facility] notes reviewed and incorporated, Labs reviewed, notes from prior ED visits and [place] Controlled Substance Database   ____________________________________________   FINAL CLINICAL IMPRESSION(S) / ED DIAGNOSES  Final diagnoses:  N&V (nausea and vomiting)  Elevated liver enzymes  Calculus of gallbladder without cholecystitis without obstruction      NEW MEDICATIONS STARTED DURING THIS VISIT:  New Prescriptions   No medications on file     Note:  This document was prepared using Dragon voice recognition software and may include unintentional dictation errors.    Merlyn Lot, MD 02/21/19 [phone]

## 2019-02-21 NOTE — H&P (Signed)
History and Physical    Barbara Harvey:[id number] DOB: Jan 14, 1956 DOA: 02/21/2019  Referring MD/NP/PA:   PCP: Mariel Kansky, MD   Patient coming from:  The patient is coming from home.  At baseline, pt is independent for most of ADL.        Chief Complaint: Abdominal pain  HPI: Barbara Harvey is a 64 y.o. female with medical history significant of hypertension, GERD, allergy, who presents with abdominal pain.  Patient states that her abdominal pain started Thursday night, which is located in the upper abdomen, initially 8 out of 10 severity, currently 1 out of 10 severity, pressure-like, constant, nonradiating.  She does not have nausea, vomiting, diarrhea.  No fever or chills.  Patient states that she tried to make herself vomit, without significant help.  Patient does not have chest pain, shortness breath, cough, symptoms of UTI or unilateral weakness.   ED Course: pt was found to have abnormal liver function (ALP 179, AST 2420, ALT 1600, total bilirubin 3.1), lipase 56, INR 0.9, pending Covid Ag test.  Temperature normal.  Blood pressure 150/93, heart rate 88, oxygen saturation 98% on room air. Pt is amitted to med-surg bed as inpt. Dr. Tobi Bastos of GI was consulted by EDP.  US-RUQ showed 1. Cholelithiasis without evidence of acute cholecystitis at this time. 2. Tiny cyst in the left lobe of the liver incidentally noted.  Review of Systems:   General: no fevers, chills, no body weight gain, has poor appetite, has fatigue HEENT: no blurry vision, hearing changes or sore throat Respiratory: no dyspnea, coughing, wheezing CV: no chest pain, no palpitations GI: no nausea, vomiting, has abdominal pain, no diarrhea, constipation GU: no dysuria, burning on urination, increased urinary frequency, hematuria  Ext: no leg edema Neuro: no unilateral weakness, numbness, or tingling, no vision change or hearing loss Skin: no rash, no skin tear. MSK: No muscle spasm, no deformity, no limitation of  range of movement in spin Heme: No easy bruising.  Travel history: No recent long distant travel.  Allergy:  Allergies  Allergen Reactions  . Pineapple Hives and Itching    Redness    Past Medical History:  Diagnosis Date  . Arthritis   . Family history of adverse reaction to anesthesia    dad-n/v   . GERD (gastroesophageal reflux disease)    occ-gas x  . Heart murmur    asymptomatic  . Thyroid nodule     Past Surgical History:  Procedure Laterality Date  . COLONOSCOPY     x3  . DILATATION & CURETTAGE/HYSTEROSCOPY WITH MYOSURE N/A 10/03/2018   Procedure: FRACTIONAL DILATATION & CURETTAGE/HYSTEROSCOPY WITH MYOSURE RESECTION OF POLYP;  Surgeon: Schermerhorn, Ihor Austin, MD;  Location: [facility] ORS;  Service: Gynecology;  Laterality: N/A;  . MOUTH SURGERY    . PARATHYROIDECTOMY    . TONSILLECTOMY     age 70    Social History:  reports that she has never smoked. She has never used smokeless tobacco. She reports current alcohol use. She reports that she does not use drugs.  Family History:  Family History  Problem Relation Age of Onset  . Stroke Mother   . Hypertension Father   . Diabetes Mellitus II Father      Prior to Admission medications   Medication Sig Start Date End Date Taking? Authorizing Provider  acetaminophen (TYLENOL) 500 MG tablet Take 1,000 mg by mouth every 8 (eight) hours as needed (pain).    [provider]  albuterol (VENTOLIN HFA)  108 (90 Base) MCG/ACT inhaler Inhale 1-2 puffs into the lungs every 4 (four) hours as needed for wheezing or shortness of breath (PT DOES NOT HAVE ASTHMA OR COPD-ONLY HAD THIS FOR WHEEZING IN 2019 DUE TO SEASONAL ALLERGIES).     [provider]  Calcium Citrate-Vitamin D (CITRACAL + D PO) Take 1 tablet by mouth 2 (two) times daily.    [provider]  fexofenadine (ALLEGRA) 180 MG tablet Take 180 mg by mouth every morning.     [provider]  fluticasone (FLONASE) 50 MCG/ACT nasal spray Place 2  sprays into both nostrils every morning.     [provider]  Magnesium Oxide 500 MG (LAX) TABS Take 500 mg by mouth 2 (two) times daily. Vear Clock    [provider]  progesterone (PROMETRIUM) 200 MG capsule Take 200 mg by mouth at bedtime.     [provider]    Physical Exam: Vitals:   02/21/19 1232 02/21/19 1245  BP: (!) 150/93   Pulse: 88   Resp: 16   Temp: 98.1 F (36.7 C)   TempSrc: Oral   SpO2: 98%   Weight:  82.1 kg  Height:  5\' 2"  (1.575 m)   General: Not in acute distress HEENT:       Eyes: PERRL, EOMI, no scleral icterus.       ENT: No discharge from the ears and nose, no pharynx injection, no tonsillar enlargement.        Neck: No JVD, no bruit, no mass felt. Heme: No neck lymph node enlargement. Cardiac: S1/S2, RRR, No murmurs, No gallops or rubs. Respiratory: No rales, wheezing, rhonchi or rubs. GI: Soft, nondistended, has tenderness in the upper abdomen, no rebound pain, no organomegaly, BS present. GU: No hematuria Ext: No pitting leg edema bilaterally. 2+DP/PT pulse bilaterally. Musculoskeletal: No joint deformities, No joint redness or warmth, no limitation of ROM in spin. Skin: No rashes.  Neuro: Alert, oriented X3, cranial nerves II-XII grossly intact, moves all extremities normally.  Psych: Patient is not psychotic, no suicidal or hemocidal ideation.  Labs on Admission: I have personally reviewed following labs and imaging studies  CBC: Recent Labs  Lab 02/21/19 1253  WBC 8.5  NEUTROABS 6.6  HGB 15.0  HCT 46.3*  MCV 90.3  PLT 315   Basic Metabolic Panel: Recent Labs  Lab 02/21/19 1253  NA 138  K 3.7  CL 99  CO2 29  GLUCOSE 108*  BUN 14  CREATININE 0.79  CALCIUM 9.7   GFR: Estimated Creatinine Clearance: 72.4 mL/min (by C-G formula based on SCr of 0.79 mg/dL). Liver Function Tests: Recent Labs  Lab 02/21/19 1253  AST 2,420*  ALT 1,600*  ALKPHOS 179*  BILITOT 3.1*  PROT 7.7  ALBUMIN 4.1   Recent  Labs  Lab 02/21/19 1253  LIPASE 56*   No results for input(s): AMMONIA in the last 168 hours. Coagulation Profile: Recent Labs  Lab 02/21/19 1253  INR 0.9   Cardiac Enzymes: Recent Labs  Lab 02/21/19 1440  CKTOTAL 35*   BNP (last 3 results) No results for input(s): PROBNP in the last 8760 hours. HbA1C: No results for input(s): HGBA1C in the last 72 hours. CBG: No results for input(s): GLUCAP in the last 168 hours. Lipid Profile: No results for input(s): CHOL, HDL, LDLCALC, TRIG, CHOLHDL, LDLDIRECT in the last 72 hours. Thyroid Function Tests: Recent Labs    02/21/19 1440  TSH 1.216   Anemia Panel: No results for input(s): VITAMINB12, FOLATE,  FERRITIN, TIBC, IRON, RETICCTPCT in the last 72 hours. Urine analysis: No results found for: COLORURINE, APPEARANCEUR, LABSPEC, PHURINE, GLUCOSEU, HGBUR, BILIRUBINUR, KETONESUR, PROTEINUR, UROBILINOGEN, NITRITE, LEUKOCYTESUR Sepsis Labs: @LABRCNTIP (procalcitonin:4,lacticidven:4) )No results found for this or any previous visit (from the past 240 hour(s)).   Radiological Exams on Admission: US Abdomen Limited RUQ  Result Date: 02/21/2019 CLINICAL DATA:  64 year old female with history of nausea and vomiting for the past 2 days. EXAM: ULTRASOUND ABDOMEN LIMITED RIGHT UPPER QUADRANT COMPARISON:  No priors. FINDINGS: Gallbladder: Multiple echogenic foci with posterior acoustic shadowing, compatible with gallstones, measuring up to 1.3 cm in diameter. Gallbladder does not appear distended. Gallbladder wall thickness is mildly increased measuring up to 4 mm. No pericholecystic fluid. Per report from the sonographer, the patient did not exhibit a sonographic Murphy's sign on examination. Common bile duct: Diameter: 4 mm. Liver: In the left lobe of the liver there is a tiny 8 x 5 x 10 mm anechoic lesion with increased through transmission, compatible with a small cyst. Within normal limits in parenchymal echogenicity. Portal vein is patent on  color Doppler imaging with normal direction of blood flow towards the liver. Other: None. IMPRESSION: 1. Cholelithiasis without evidence of acute cholecystitis at this time. 2. Tiny cyst in the left lobe of the liver incidentally noted. Electronically Signed   By: Vinnie Langton M.D.   On: 02/21/2019 13:46     EKG:  Not done in ED, will get one.   Assessment/Plan Principal Problem:   Abdominal pain Active Problems:   HTN (hypertension)   Abnormal LFTs   Cholelithiasis   Allergy   Abdominal pain, abnormal LFTs and cholelithiasis: US-RUQ showed cholelithiasis without evidence of acute cholecystitis at this time. Dr. Vicente Males of GI was consulted -->recommended MRCP  -will admit to med-surg bed -MRCP is ordered by EDP -prn morphine for pain and Zofran for nausea -Check hepatitis panel -Avoid using Tylenol -npo now -IVF: 100 cc/h of NS  HTN:  -Continue home medications: Amlodipine -hydralazine prn  Hx of Allergy: -Continue home medication   Inpatient status:  # Patient requires inpatient status due to high intensity of service, high risk for further deterioration and high frequency of surveillance required.  I certify that at the point of admission it is my clinical judgment that the patient will require inpatient hospital care spanning beyond 2 midnights from the point of admission.  . This patient has multiple chronic comorbidities including hypertension, GERD, allergy . Now patient has presenting with abdominal pain, abnormal LFTs and cholelithiasis: . The worrisome physical exam findings include tenderness in upper abdomen . The initial radiographic and laboratory data are worrisome because of abnormal liver function and a cholelithiasis by ultrasound of RUQ . Current medical needs: please see my assessment and plan Predictability of an adverse outcome (risk): Patient has multiple comorbidities as listed above. Now presents with abdominal pain, abnormal LFTs and  cholelithiasis. Patient's presentation is highly complicated. May need procedure. Patient is at high risk of deteriorating.  Will need to be treated in hospital for at least 2 days.     DVT ppx: SCD Code Status: Full code Family Communication: None at bed side.  Disposition Plan:  Anticipate discharge back to previous home environment Consults called:  Dr. Vicente Males of GI Admission status: Med-surg bed as inpt    Date of Service 02/21/2019    [facility]   If 7PM-7AM, please contact night-coverage [email or web address] Password [facility] 02/21/2019, 4:35 PM

## 2019-02-21 NOTE — ED Triage Notes (Signed)
Pt arrived via POV with reports of upper abdominal pain, reports feeling bloated, pt states she made herself vomit to see if made her feel better.  Pt states sxs started around early Friday morning. Pt states sxs have been intermittent since Friday.  Pt describes the pain feels like a heavy pressure.  Pt was at [facility] today had labs drawn and was found to have elevated AST/ALT and alk phos.

## 2019-02-21 NOTE — ED Notes (Signed)
First Nurse Note: Pt to ED via [facility] in. Pt has been having Epi gastric abd pain x 2 day. Pt had labs at [facility]- AST 2,069, ALT 1, 294, Alk phos 201. Pt is in NAD.

## 2019-02-22 DIAGNOSIS — R748 Abnormal levels of other serum enzymes: Secondary | ICD-10-CM

## 2019-02-22 DIAGNOSIS — R1013 Epigastric pain: Secondary | ICD-10-CM

## 2019-02-22 DIAGNOSIS — R945 Abnormal results of liver function studies: Secondary | ICD-10-CM

## 2019-02-22 LAB — HIV ANTIBODY (ROUTINE TESTING W REFLEX): HIV Screen 4th Generation wRfx: NONREACTIVE

## 2019-02-22 LAB — HEPATIC FUNCTION PANEL
ALT: 1250 U/L — ABNORMAL HIGH (ref 0–44)
AST: 769 U/L — ABNORMAL HIGH (ref 15–41)
Albumin: 3.5 g/dL (ref 3.5–5.0)
Alkaline Phosphatase: 229 U/L — ABNORMAL HIGH (ref 38–126)
Bilirubin, Direct: 0.5 mg/dL — ABNORMAL HIGH (ref 0.0–0.2)
Indirect Bilirubin: 0.9 mg/dL (ref 0.3–0.9)
Total Bilirubin: 1.4 mg/dL — ABNORMAL HIGH (ref 0.3–1.2)
Total Protein: 6.8 g/dL (ref 6.5–8.1)

## 2019-02-22 LAB — COMPREHENSIVE METABOLIC PANEL
ALT: 1588 U/L — ABNORMAL HIGH (ref 0–44)
AST: 1381 U/L — ABNORMAL HIGH (ref 15–41)
Albumin: 3.6 g/dL (ref 3.5–5.0)
Alkaline Phosphatase: 190 U/L — ABNORMAL HIGH (ref 38–126)
Anion gap: 7 (ref 5–15)
BUN: 13 mg/dL (ref 8–23)
CO2: 28 mmol/L (ref 22–32)
Calcium: 8.4 mg/dL — ABNORMAL LOW (ref 8.9–10.3)
Chloride: 105 mmol/L (ref 98–111)
Creatinine, Ser: 0.82 mg/dL (ref 0.44–1.00)
GFR calc Af Amer: >60 mL/min (ref >60)
GFR calc non Af Amer: >60 mL/min (ref >60)
Glucose, Bld: 101 mg/dL — ABNORMAL HIGH (ref 70–99)
Potassium: 3.5 mmol/L (ref 3.5–5.1)
Sodium: 140 mmol/L (ref 135–145)
Total Bilirubin: 3.3 mg/dL — ABNORMAL HIGH (ref 0.3–1.2)
Total Protein: 6.6 g/dL (ref 6.5–8.1)

## 2019-02-22 LAB — CBC
HCT: 41 % (ref 36.0–46.0)
Hemoglobin: 13 g/dL (ref 12.0–15.0)
MCH: 28.9 pg (ref 26.0–34.0)
MCHC: 31.7 g/dL (ref 30.0–36.0)
MCV: 91.1 fL (ref 80.0–100.0)
Platelets: 244 10*3/uL (ref 150–400)
RBC: 4.5 MIL/uL (ref 3.87–5.11)
RDW: 13.5 % (ref 11.5–15.5)
WBC: 5.1 10*3/uL (ref 4.0–10.5)
nRBC: 0 % (ref 0.0–0.2)

## 2019-02-22 LAB — HEPATITIS PANEL, ACUTE
HCV Ab: NONREACTIVE
Hep A IgM: NONREACTIVE
Hep B C IgM: NONREACTIVE
Hepatitis B Surface Ag: NONREACTIVE

## 2019-02-22 LAB — GLUCOSE, CAPILLARY: Glucose-Capillary: 86 mg/dL (ref 70–99)

## 2019-02-22 LAB — GAMMA GT: GGT: 239 U/L — ABNORMAL HIGH (ref 7–50)

## 2019-02-22 NOTE — ED Notes (Signed)
Pt ambulatory to toilet with steady gait noted.  

## 2019-02-22 NOTE — ED Notes (Signed)
Pt ambulatory to toilet with steady gait noted.  Pt denies pain after eating lunch.

## 2019-02-22 NOTE — Consult Note (Signed)
[name] , MD [address] [place] [address], [address], [place], [place], [address] [address], [place], [place], [address] Phone: [phone]  Fax: [phone]  Consultation  Referring Provider:     No ref. provider found Primary Care Physician:  Mariel Kansky, MD Primary Gastroenterologist:    Dr. Norma Fredrickson       Reason for Consultation:   Abnormal LFTs  Date of Admission:  02/21/2019 Date of Consultation:  02/22/2019         HPI:   Barbara Harvey is a 64 y.o. female scented to the emergency room with abdominal pain.   In the emergency room was found to have elevated LFTs predominantly transaminases.  Right upper quadrant ultrasound showed cholelithiasis without evidence of cholecystitis.  Subsequently MRCP was performed.  No acute findings.  No choledocholithiasis or biliary tract obstruction. On admission hemoglobin was 15 g with a white cell count of 8.5 creatinine of 0.79.  AST of 2400, ALT thousand 600 and total bilirubin of 3.1.  Alkaline phosphatase of 179.  Lipase was normal at 56.  INR was normal at 0.9.  Acute hepatitis panel was negative.  Acetaminophen levels were in normal range.  CK levels were not elevated.  Normal GGT was 239.  TSH was normal testing for Covid was negative. HIV negative.  Transaminases are improved today . 2 day history of epigastric discomfort and bloating, on and off , non radiating , presently resolved, normal bowel movements. No alcohol, herbal supplements.  No new medications started . Has taken 2 tyelnol 1-2 days. Shingles vaccines 2 months back with rigors. , blisters on her arm. No covid contacts. No sick contacts, she lives alone. Works in an office by herself. No diarrhea or vomiting.   Past Medical History:  Diagnosis Date  . Arthritis   . Family history of adverse reaction to anesthesia    dad-n/v   . GERD (gastroesophageal reflux disease)    occ-gas x  . Heart murmur    asymptomatic  . Thyroid nodule     Past Surgical History:    Procedure Laterality Date  . COLONOSCOPY     x3  . DILATATION & CURETTAGE/HYSTEROSCOPY WITH MYOSURE N/A 10/03/2018   Procedure: FRACTIONAL DILATATION & CURETTAGE/HYSTEROSCOPY WITH MYOSURE RESECTION OF POLYP;  Surgeon: Schermerhorn, Ihor Austin, MD;  Location: [facility] ORS;  Service: Gynecology;  Laterality: N/A;  . MOUTH SURGERY    . PARATHYROIDECTOMY    . TONSILLECTOMY     age 60    Prior to Admission medications   Medication Sig Start Date End Date Taking? Authorizing Provider  amLODipine (NORVASC) 5 MG tablet Take 5 mg by mouth daily. 12/22/18  Yes [provider]  Calcium Citrate-Vitamin D (CITRACAL + D PO) Take 1 tablet by mouth 2 (two) times daily.   Yes [provider]  fexofenadine (ALLEGRA) 180 MG tablet Take 180 mg by mouth every morning.    Yes [provider]  fluticasone (FLONASE) 50 MCG/ACT nasal spray Place 2 sprays into both nostrils every morning.    Yes [provider]  Magnesium Oxide 500 MG (LAX) TABS Take 500 mg by mouth 2 (two) times daily. Vear Clock   Yes [provider]  montelukast (SINGULAIR) 10 MG tablet Take 10 mg by mouth daily. 02/11/19  Yes [provider]  acetaminophen (TYLENOL) 500 MG tablet Take 1,000 mg by mouth every 8 (eight) hours as needed (pain).    [provider]  albuterol (VENTOLIN HFA) 108 (90 Base)  MCG/ACT inhaler Inhale 1-2 puffs into the lungs every 4 (four) hours as needed for wheezing or shortness of breath (PT DOES NOT HAVE ASTHMA OR COPD-ONLY HAD THIS FOR WHEEZING IN 2019 DUE TO SEASONAL ALLERGIES).     [provider]  progesterone (PROMETRIUM) 200 MG capsule Take 200 mg by mouth at bedtime.     [provider]    Family History  Problem Relation Age of Onset  . Stroke Mother   . Hypertension Father   . Diabetes Mellitus II Father      Social History   Tobacco Use  . Smoking status: Never Smoker  . Smokeless tobacco: Never Used  Substance Use Topics  .  Alcohol use: Yes    Comment: rare   . Drug use: Never    Allergies as of 02/21/2019 - Review Complete 02/21/2019  Allergen Reaction Noted  . Pineapple Hives and Itching 09/15/2018    Review of Systems:    All systems reviewed and negative except where noted in HPI.   Physical Exam:  Vital signs in last 24 hours: Pulse Rate:  [65-92] 88 (01/17 1230) Resp:  [16-20] 20 (01/17 1230) BP: (104-152)/(51-96) 134/71 (01/17 1230) SpO2:  [93 %-99 %] 93 % (01/17 1230) Weight:  [82.1 kg] 82.1 kg (01/16 1245)   General:   Pleasant, cooperative in NAD Head:  Normocephalic and atraumatic. Eyes:   No icterus.   Conjunctiva pink. PERRLA. Ears:  Normal auditory acuity. Neck:  Supple; no masses or thyroidomegaly Lungs: Respirations even and unlabored. Lungs clear to auscultation bilaterally.   No wheezes, crackles, or rhonchi.  Heart:  Regular rate and rhythm;  Without murmur, clicks, rubs or gallops Abdomen:  Soft, nondistended, nontender. Normal bowel sounds. No appreciable masses or hepatomegaly.  No rebound or guarding.  Neurologic:  Alert and oriented x3;  grossly normal neurologically. Skin:  Intact without significant lesions or rashes. Cervical Nodes:  No significant cervical adenopathy. Psych:  Alert and cooperative. Normal affect.  LAB RESULTS: Recent Labs    02/21/19 1253 02/22/19 0419  WBC 8.5 5.1  HGB 15.0 13.0  HCT 46.3* 41.0  PLT 315 244   BMET Recent Labs    02/21/19 1253 02/22/19 0419  NA 138 140  K 3.7 3.5  CL 99 105  CO2 29 28  GLUCOSE 108* 101*  BUN 14 13  CREATININE 0.79 0.82  CALCIUM 9.7 8.4*   LFT Recent Labs    02/22/19 0419  PROT 6.6  ALBUMIN 3.6  AST 1,381*  ALT 1,588*  ALKPHOS 190*  BILITOT 3.3*   PT/INR Recent Labs    02/21/19 1253  LABPROT 12.1  INR 0.9    STUDIES: MR ABDOMEN MRCP WO CONTRAST  Result Date: 02/21/2019 CLINICAL DATA:  64 year old female with history of 2-3 days of intermittent epigastric pain. Bloating sensation.  Nausea. EXAM: MRI ABDOMEN WITHOUT CONTRAST  (INCLUDING MRCP) TECHNIQUE: Multiplanar multisequence MR imaging of the abdomen was performed. Heavily T2-weighted images of the biliary and pancreatic ducts were obtained, and three-dimensional MRCP images were rendered by post processing. COMPARISON:  No priors. FINDINGS: Comment: Today's study is limited for detection and characterization of visceral and/or vascular lesions by lack of IV gadolinium. Lower chest: Unremarkable. Hepatobiliary: 8 mm T1 hypointense, T2 hyperintense lesion in segment 3 of the liver, incompletely characterized on today's noncontrast examination, but statistically likely a small cyst. No other larger more suspicious appearing hepatic lesions. No intra or extrahepatic biliary ductal dilatation noted on MRCP images. Common bile duct  measures 5 mm in the porta hepatis. No filling defect in the common bile duct to suggest choledocholithiasis. There multiple filling defects in the gallbladder, compatible with gallstones, measuring up to 1.4 cm. Gallbladder is not distended. Gallbladder wall thickness is normal. No pericholecystic fluid. Pancreas: No pancreatic mass confidently identified on today's noncontrast examination. No pancreatic ductal dilatation noted on MRCP images. No pancreatic or peripancreatic fluid collections or inflammatory changes. Spleen:  Unremarkable. Adrenals/Urinary Tract: Bilateral kidneys and adrenal glands are normal in appearance. No hydroureteronephrosis in the visualized portions of the abdomen. Stomach/Bowel: Stomach and visualized portions of small bowel and colon are unremarkable in appearance. Vascular/Lymphatic: No aneurysm identified in the visualized portions of the abdomen. No lymphadenopathy noted in the abdomen. Other: No significant volume of ascites noted in the visualized portions of the peritoneal cavity. Musculoskeletal: No aggressive appearing osseous lesions are noted in the visualized portions of the  skeleton. IMPRESSION: 1. No acute findings are noted in the abdomen to account for the patient's symptoms on today's noncontrast examination. 2. Cholelithiasis without evidence of acute cholecystitis. 3. No evidence of choledocholithiasis or biliary tract obstruction. Electronically Signed   By: Trudie Reed M.D.   On: 02/21/2019 17:13   MR 3D Recon At Scanner  Result Date: 02/21/2019 CLINICAL DATA:  64 year old female with history of 2-3 days of intermittent epigastric pain. Bloating sensation. Nausea. EXAM: MRI ABDOMEN WITHOUT CONTRAST  (INCLUDING MRCP) TECHNIQUE: Multiplanar multisequence MR imaging of the abdomen was performed. Heavily T2-weighted images of the biliary and pancreatic ducts were obtained, and three-dimensional MRCP images were rendered by post processing. COMPARISON:  No priors. FINDINGS: Comment: Today's study is limited for detection and characterization of visceral and/or vascular lesions by lack of IV gadolinium. Lower chest: Unremarkable. Hepatobiliary: 8 mm T1 hypointense, T2 hyperintense lesion in segment 3 of the liver, incompletely characterized on today's noncontrast examination, but statistically likely a small cyst. No other larger more suspicious appearing hepatic lesions. No intra or extrahepatic biliary ductal dilatation noted on MRCP images. Common bile duct measures 5 mm in the porta hepatis. No filling defect in the common bile duct to suggest choledocholithiasis. There multiple filling defects in the gallbladder, compatible with gallstones, measuring up to 1.4 cm. Gallbladder is not distended. Gallbladder wall thickness is normal. No pericholecystic fluid. Pancreas: No pancreatic mass confidently identified on today's noncontrast examination. No pancreatic ductal dilatation noted on MRCP images. No pancreatic or peripancreatic fluid collections or inflammatory changes. Spleen:  Unremarkable. Adrenals/Urinary Tract: Bilateral kidneys and adrenal glands are normal in  appearance. No hydroureteronephrosis in the visualized portions of the abdomen. Stomach/Bowel: Stomach and visualized portions of small bowel and colon are unremarkable in appearance. Vascular/Lymphatic: No aneurysm identified in the visualized portions of the abdomen. No lymphadenopathy noted in the abdomen. Other: No significant volume of ascites noted in the visualized portions of the peritoneal cavity. Musculoskeletal: No aggressive appearing osseous lesions are noted in the visualized portions of the skeleton. IMPRESSION: 1. No acute findings are noted in the abdomen to account for the patient's symptoms on today's noncontrast examination. 2. Cholelithiasis without evidence of acute cholecystitis. 3. No evidence of choledocholithiasis or biliary tract obstruction. Electronically Signed   By: Trudie Reed M.D.   On: 02/21/2019 17:13   US Abdomen Limited RUQ  Result Date: 02/21/2019 CLINICAL DATA:  64 year old female with history of nausea and vomiting for the past 2 days. EXAM: ULTRASOUND ABDOMEN LIMITED RIGHT UPPER QUADRANT COMPARISON:  No priors. FINDINGS: Gallbladder: Multiple echogenic foci with posterior acoustic  shadowing, compatible with gallstones, measuring up to 1.3 cm in diameter. Gallbladder does not appear distended. Gallbladder wall thickness is mildly increased measuring up to 4 mm. No pericholecystic fluid. Per report from the sonographer, the patient did not exhibit a sonographic Murphy's sign on examination. Common bile duct: Diameter: 4 mm. Liver: In the left lobe of the liver there is a tiny 8 x 5 x 10 mm anechoic lesion with increased through transmission, compatible with a small cyst. Within normal limits in parenchymal echogenicity. Portal vein is patent on color Doppler imaging with normal direction of blood flow towards the liver. Other: None. IMPRESSION: 1. Cholelithiasis without evidence of acute cholecystitis at this time. 2. Tiny cyst in the left lobe of the liver  incidentally noted. Electronically Signed   By: Trudie Reed M.D.   On: 02/21/2019 13:46      Impression / Plan:   SHAVONN CONVEY is a 64 y.o. y/o female admitted with abdominal pain.  Predominantly a picture of hepatitis.  Acute hepatitis panel is negative.  No obstruction seen on imaging.  This pattern of abnormal LFTs is usually seen either with ischemia, medications/toxins or acute viral hepatitis.  Plan  1.  Avoid any hepatotoxic drugs  2.  Check hepatitis B, C viral loads, HIV, EBV, CMV, HSV, VZV viral loads as during the window.  The antibody may be negative and may have a positive viral load.  3.  Doppler ultrasound of the hepatic vasculature to rule out portal or hepatic vein thrombosis.  4.  The fact that the transaminases are improving today would suggest that the course is usually benign and recovers with supportive therapy.  No evidence of acute liver failure at the INR is within normal limits.              5. This is the second episode of epigastric discomfort she has had ? Biliary colic- usually if a stone is passed transaminases do not rise this high. If has recurrent epigastric pains may need to consider cholecystectomy.                  6. Start normal diet - if tolerates well - recheck LFT's and if trending down can go home later this evening or tomorrow morning and repeat LFT's in 24-48 hours with her PCP and follow up with her GI physician Dr Norma Fredrickson .    Thank you for involving me in the care of this patient.      LOS: 1 day   [name], MD  02/22/2019, 12:40 PM

## 2019-02-22 NOTE — ED Notes (Addendum)
Report given to Anessa, RN 

## 2019-02-22 NOTE — Progress Notes (Signed)
PROGRESS NOTE    Barbara Harvey  IRJ:[id number] DOB: 1955-07-20 DOA: 02/21/2019 PCP: Minda Ditto, MD    Assessment & Plan:   Principal Problem:   Abdominal pain Active Problems:   HTN (hypertension)   Abnormal LFTs   Cholelithiasis   Allergy    Barbara Harvey is a 64 y.o. Caucasian female with medical history significant of hypertension, GERD, allergy, who presented with abdominal pain.   Abdominal pain,  Abnormal LFTs and hepatitis cholelithiasis US-RUQ showed cholelithiasis without evidence of acute cholecystitis at this time. MRCP neg for choledocholithiasis or biliary tract obstruction.  Per GI, "This pattern of abnormal LFTs is usually seen either with ischemia, medications/toxins or acute viral hepatitis." --Hepatitis panel neg. --LFT's trended down this morning. --GI Dr. Vicente Males following PLAN per GI: --Check hepatitis B, C viral loads, HIV, EBV, CMV, HSV, VZV viral loads as during the window.  The antibody may be negative and may have a positive viral load. --Doppler ultrasound of the hepatic vasculature to rule out portal or hepatic vein thrombosis. --If has recurrent epigastric pains may need to consider cholecystectomy.    --Start normal diet - if tolerates well - recheck LFT's and if trending down can go home tomorrow morning  --repeat LFT's in 24-48 hours with her PCP and follow up with her GI physician Dr Alice Reichert .   HTN:  -Continue home medications: Amlodipine -hydralazine prn  Hx of Allergy: -Continue home medication   DVT prophylaxis: SCD/Compression stockings Code Status: Full code  Family Communication: not today Disposition Plan: Home tomorrow if LFT's trended down   Subjective and Interval History:  Barbara Harvey reported abdominal pain resolved.  Had a solid lunch.  Has been having normal BM's.  No fever, dyspnea, chest pain, N/V/D, dysuria, increased swelling.  LFT trended down this morning.   Objective: Vitals:   02/22/19 1500 02/22/19 1530  02/22/19 1808 02/22/19 1825  BP: 100/62 106/62 133/70 126/63  Pulse: (!) 102 100 94 92  Resp: 16  18 18   Temp:    98.6 F (37 C)  TempSrc:    Oral  SpO2: 97% 95% 95% 98%  Weight:      Height:       No intake or output data in the 24 hours ending 02/22/19 1926 Filed Weights   02/21/19 1245  Weight: 82.1 kg    Examination:   Constitutional: NAD, AAOx3 HEENT: conjunctivae and lids normal, EOMI CV: RRR no M,R,G. Distal pulses +2.  No cyanosis.  RESP: CTA B/L, normal respiratory effort  GI: +BS, NTND Extremities: No effusions, edema, or tenderness in BLE SKIN: warm, dry and intact Neuro: II - XII grossly intact.  Sensation intact Psych: Normal mood and affect.  Appropriate judgement and reason   Data Reviewed: I have personally reviewed following labs and imaging studies  CBC: Recent Labs  Lab 02/21/19 1253 02/22/19 0419  WBC 8.5 5.1  NEUTROABS 6.6  --   HGB 15.0 13.0  HCT 46.3* 41.0  MCV 90.3 91.1  PLT 315 301   Basic Metabolic Panel: Recent Labs  Lab 02/21/19 1253 02/22/19 0419  NA 138 140  K 3.7 3.5  CL 99 105  CO2 29 28  GLUCOSE 108* 101*  BUN 14 13  CREATININE 0.79 0.82  CALCIUM 9.7 8.4*   GFR: Estimated Creatinine Clearance: 70.6 mL/min (by C-G formula based on SCr of 0.82 mg/dL). Liver Function Tests: Recent Labs  Lab 02/21/19 1253 02/22/19 0419  AST 2,420* 1,381*  ALT 1,600* 1,588*  ALKPHOS 179* 190*  BILITOT 3.1* 3.3*  PROT 7.7 6.6  ALBUMIN 4.1 3.6   Recent Labs  Lab 02/21/19 1253  LIPASE 56*   No results for input(s): AMMONIA in the last 168 hours. Coagulation Profile: Recent Labs  Lab 02/21/19 1253  INR 0.9   Cardiac Enzymes: Recent Labs  Lab 02/21/19 1440  CKTOTAL 35*   BNP (last 3 results) No results for input(s): PROBNP in the last 8760 hours. HbA1C: No results for input(s): HGBA1C in the last 72 hours. CBG: Recent Labs  Lab 02/22/19 0828  GLUCAP 86   Lipid Profile: No results for input(s): CHOL, HDL,  LDLCALC, TRIG, CHOLHDL, LDLDIRECT in the last 72 hours. Thyroid Function Tests: Recent Labs    02/21/19 1440  TSH 1.216   Anemia Panel: No results for input(s): VITAMINB12, FOLATE, FERRITIN, TIBC, IRON, RETICCTPCT in the last 72 hours. Sepsis Labs: No results for input(s): PROCALCITON, LATICACIDVEN in the last 168 hours.  No results found for this or any previous visit (from the past 240 hour(s)).    Radiology Studies: MR ABDOMEN MRCP WO CONTRAST  Result Date: 02/21/2019 CLINICAL DATA:  64 year old female with history of 2-3 days of intermittent epigastric pain. Bloating sensation. Nausea. EXAM: MRI ABDOMEN WITHOUT CONTRAST  (INCLUDING MRCP) TECHNIQUE: Multiplanar multisequence MR imaging of the abdomen was performed. Heavily T2-weighted images of the biliary and pancreatic ducts were obtained, and three-dimensional MRCP images were rendered by post processing. COMPARISON:  No priors. FINDINGS: Comment: Today's study is limited for detection and characterization of visceral and/or vascular lesions by lack of IV gadolinium. Lower chest: Unremarkable. Hepatobiliary: 8 mm T1 hypointense, T2 hyperintense lesion in segment 3 of the liver, incompletely characterized on today's noncontrast examination, but statistically likely a small cyst. No other larger more suspicious appearing hepatic lesions. No intra or extrahepatic biliary ductal dilatation noted on MRCP images. Common bile duct measures 5 mm in the porta hepatis. No filling defect in the common bile duct to suggest choledocholithiasis. There multiple filling defects in the gallbladder, compatible with gallstones, measuring up to 1.4 cm. Gallbladder is not distended. Gallbladder wall thickness is normal. No pericholecystic fluid. Pancreas: No pancreatic mass confidently identified on today's noncontrast examination. No pancreatic ductal dilatation noted on MRCP images. No pancreatic or peripancreatic fluid collections or inflammatory changes.  Spleen:  Unremarkable. Adrenals/Urinary Tract: Bilateral kidneys and adrenal glands are normal in appearance. No hydroureteronephrosis in the visualized portions of the abdomen. Stomach/Bowel: Stomach and visualized portions of small bowel and colon are unremarkable in appearance. Vascular/Lymphatic: No aneurysm identified in the visualized portions of the abdomen. No lymphadenopathy noted in the abdomen. Other: No significant volume of ascites noted in the visualized portions of the peritoneal cavity. Musculoskeletal: No aggressive appearing osseous lesions are noted in the visualized portions of the skeleton. IMPRESSION: 1. No acute findings are noted in the abdomen to account for the patient's symptoms on today's noncontrast examination. 2. Cholelithiasis without evidence of acute cholecystitis. 3. No evidence of choledocholithiasis or biliary tract obstruction. Electronically Signed   By: Trudie Reed M.D.   On: 02/21/2019 17:13   MR 3D Recon At Scanner  Result Date: 02/21/2019 CLINICAL DATA:  64 year old female with history of 2-3 days of intermittent epigastric pain. Bloating sensation. Nausea. EXAM: MRI ABDOMEN WITHOUT CONTRAST  (INCLUDING MRCP) TECHNIQUE: Multiplanar multisequence MR imaging of the abdomen was performed. Heavily T2-weighted images of the biliary and pancreatic ducts were obtained, and three-dimensional MRCP images were rendered by post processing. COMPARISON:  No priors.  FINDINGS: Comment: Today's study is limited for detection and characterization of visceral and/or vascular lesions by lack of IV gadolinium. Lower chest: Unremarkable. Hepatobiliary: 8 mm T1 hypointense, T2 hyperintense lesion in segment 3 of the liver, incompletely characterized on today's noncontrast examination, but statistically likely a small cyst. No other larger more suspicious appearing hepatic lesions. No intra or extrahepatic biliary ductal dilatation noted on MRCP images. Common bile duct measures 5 mm in  the porta hepatis. No filling defect in the common bile duct to suggest choledocholithiasis. There multiple filling defects in the gallbladder, compatible with gallstones, measuring up to 1.4 cm. Gallbladder is not distended. Gallbladder wall thickness is normal. No pericholecystic fluid. Pancreas: No pancreatic mass confidently identified on today's noncontrast examination. No pancreatic ductal dilatation noted on MRCP images. No pancreatic or peripancreatic fluid collections or inflammatory changes. Spleen:  Unremarkable. Adrenals/Urinary Tract: Bilateral kidneys and adrenal glands are normal in appearance. No hydroureteronephrosis in the visualized portions of the abdomen. Stomach/Bowel: Stomach and visualized portions of small bowel and colon are unremarkable in appearance. Vascular/Lymphatic: No aneurysm identified in the visualized portions of the abdomen. No lymphadenopathy noted in the abdomen. Other: No significant volume of ascites noted in the visualized portions of the peritoneal cavity. Musculoskeletal: No aggressive appearing osseous lesions are noted in the visualized portions of the skeleton. IMPRESSION: 1. No acute findings are noted in the abdomen to account for the patient's symptoms on today's noncontrast examination. 2. Cholelithiasis without evidence of acute cholecystitis. 3. No evidence of choledocholithiasis or biliary tract obstruction. Electronically Signed   By: Trudie Reed M.D.   On: 02/21/2019 17:13   US Abdomen Limited RUQ  Result Date: 02/21/2019 CLINICAL DATA:  64 year old female with history of nausea and vomiting for the past 2 days. EXAM: ULTRASOUND ABDOMEN LIMITED RIGHT UPPER QUADRANT COMPARISON:  No priors. FINDINGS: Gallbladder: Multiple echogenic foci with posterior acoustic shadowing, compatible with gallstones, measuring up to 1.3 cm in diameter. Gallbladder does not appear distended. Gallbladder wall thickness is mildly increased measuring up to 4 mm. No  pericholecystic fluid. Per report from the sonographer, the patient did not exhibit a sonographic Murphy's sign on examination. Common bile duct: Diameter: 4 mm. Liver: In the left lobe of the liver there is a tiny 8 x 5 x 10 mm anechoic lesion with increased through transmission, compatible with a small cyst. Within normal limits in parenchymal echogenicity. Portal vein is patent on color Doppler imaging with normal direction of blood flow towards the liver. Other: None. IMPRESSION: 1. Cholelithiasis without evidence of acute cholecystitis at this time. 2. Tiny cyst in the left lobe of the liver incidentally noted. Electronically Signed   By: Trudie Reed M.D.   On: 02/21/2019 13:46     Scheduled Meds: . amLODipine  5 mg Oral Daily  . fluticasone  2 spray Each Nare [id number]  . loratadine  10 mg Oral Daily  . magnesium oxide  400 mg Oral BID  . montelukast  10 mg Oral Daily  . ondansetron (ZOFRAN) IV  4 mg Intravenous Once   Continuous Infusions: . sodium chloride 100 mL/hr at 02/21/19 1850     LOS: 1 day     Darlin Priestly, MD [facility] If 7PM-7AM, please contact night-coverage 02/22/2019, 7:26 PM

## 2019-02-22 NOTE — ED Notes (Signed)
Pt provided lunch tray, sitting up to eat.

## 2019-02-23 ENCOUNTER — Other Ambulatory Visit: Payer: Self-pay

## 2019-02-23 ENCOUNTER — Encounter: Payer: Self-pay | Admitting: Internal Medicine

## 2019-02-23 ENCOUNTER — Inpatient Hospital Stay: Payer: BC Managed Care – PPO

## 2019-02-23 LAB — COMPREHENSIVE METABOLIC PANEL
ALT: 1072 U/L — ABNORMAL HIGH (ref 0–44)
AST: 493 U/L — ABNORMAL HIGH (ref 15–41)
Albumin: 3.5 g/dL (ref 3.5–5.0)
Alkaline Phosphatase: 210 U/L — ABNORMAL HIGH (ref 38–126)
Anion gap: 10 (ref 5–15)
BUN: 20 mg/dL (ref 8–23)
CO2: 25 mmol/L (ref 22–32)
Calcium: 8.6 mg/dL — ABNORMAL LOW (ref 8.9–10.3)
Chloride: 106 mmol/L (ref 98–111)
Creatinine, Ser: 0.97 mg/dL (ref 0.44–1.00)
GFR calc Af Amer: >60 mL/min (ref >60)
GFR calc non Af Amer: >60 mL/min (ref >60)
Glucose, Bld: 86 mg/dL (ref 70–99)
Potassium: 3.5 mmol/L (ref 3.5–5.1)
Sodium: 141 mmol/L (ref 135–145)
Total Bilirubin: 1.3 mg/dL — ABNORMAL HIGH (ref 0.3–1.2)
Total Protein: 6.8 g/dL (ref 6.5–8.1)

## 2019-02-23 LAB — CBC
HCT: 43.7 % (ref 36.0–46.0)
Hemoglobin: 13.8 g/dL (ref 12.0–15.0)
MCH: 29.4 pg (ref 26.0–34.0)
MCHC: 31.6 g/dL (ref 30.0–36.0)
MCV: 93.2 fL (ref 80.0–100.0)
Platelets: 279 10*3/uL (ref 150–400)
RBC: 4.69 MIL/uL (ref 3.87–5.11)
RDW: 13.7 % (ref 11.5–15.5)
WBC: 5.5 10*3/uL (ref 4.0–10.5)
nRBC: 0 % (ref 0.0–0.2)

## 2019-02-23 LAB — MAGNESIUM: Magnesium: 2.7 mg/dL — ABNORMAL HIGH (ref 1.7–2.4)

## 2019-02-23 LAB — GLUCOSE, CAPILLARY: Glucose-Capillary: 88 mg/dL (ref 70–99)

## 2019-02-23 LAB — HEPATITIS B DNA, ULTRAQUANTITATIVE, PCR
HBV DNA SERPL PCR-ACNC: NOT DETECTED IU/mL
HBV DNA SERPL PCR-LOG IU: UNDETERMINED log10 IU/mL

## 2019-02-23 MED ORDER — SENNOSIDES-DOCUSATE SODIUM 8.6-50 MG PO TABS: 1.0000 | ORAL_TABLET | Freq: Two times a day (BID) | ORAL | Status: DC

## 2019-02-23 NOTE — Discharge Instructions (Signed)
Liver Failure  The liver is a large organ in the upper right-hand side of the abdomen. It is involved in many important functions, including storing energy, producing fluids that the body needs, and removing harmful substances from the bloodstream. Liver failure is a condition in which the liver loses its ability to function due to injury or disease. Liver failure can develop quickly, over days or weeks (acute liver failure). It can also develop gradually, over months or years (chronic liver failure). What are the causes? Common causes of acute liver failure include:  Acetaminophen overdose.  Reaction to certain medicines.  Liver infection (viral hepatitis). Common causes of chronic liver failure include:  Viral hepatitis.  Liver disease.  Alcohol abuse. What are the signs or symptoms? Most people do not have symptoms in the early stages of liver failure. If early symptoms do develop, they may include:  Loss of appetite.  Nausea and vomiting.  Weakness and tiredness (fatigue).  Weight loss without trying.  Diarrhea. As the condition worsens, symptoms of this condition may include:  Yellowing of the skin and the whites of the eyes (jaundice).  Bruising and bleeding easily.  Itchy skin (pruritus).  Fluid buildup in the abdomen (ascites).  Personality and mood changes.  Confusion and sleepiness. How is this diagnosed? This condition is diagnosed based on your symptoms, your medical history, and a physical exam. You may have tests, including:  Blood tests.  CT scan.  MRI.  Ultrasound.  Removal of a small amount of liver tissue to be examined under a microscope (biopsy). Follow these instructions at home: Eating and drinking  Follow instructions from your health care provider about eating and drinking restrictions. This may include: ? Limiting the amount of animal protein that you eat. ? Increasing the amount of plant-based protein that you eat. Foods that  contain plant-based proteins include whole grains, nuts, and vegetables. ? Taking vitamin supplements. ? Limiting the amount of salt that you eat. Lifestyle   Do not drink alcohol.  Do not use any products that contain nicotine or tobacco, such as cigarettes, e-cigarettes, and chewing tobacco. If you need help quitting, ask your health care provider.  Exercise regularly, as told by your health care provider. General instructions  Take over-the-counter and prescription medicines only as told by your health care provider.  Follow instructions from your health care provider about maintaining your vaccinations, especially vaccinations against hepatitis A and B.  Keep all follow-up visits as told by your health care provider. This is important. Contact a health care provider if:  You have symptoms that get worse.  You lose a lot of weight without trying.  You have a fever or chills. Get help right away if:  You become confused or very sleepy.  You cannot take care of yourself or be taken care of at home.  You are not urinating.  You have difficulty breathing.  You vomit blood. Summary  Liver failure is a condition in which the liver loses its ability to function due to injury or disease.  Treatment for this condition depends on the cause and severity of symptoms.  Take over-the-counter and prescription medicines only as told by your health care provider.  Contact a health care provider if you have symptoms that get worse.  Keep all follow-up visits as told by your health care provider. This is important. This information is not intended to replace advice given to you by your health care provider. Make sure you discuss any questions you have  with your health care provider. Document Revised: 07/03/2017 Document Reviewed: 07/03/2017 Elsevier Patient Education  2020 [employer] Eating Plan for Hepatitis The liver plays an important role in the digestion of food and the  storage of nutrients. Chronic hepatitis infection affects how the liver functions and the way the body uses energy from food. It is important to follow an eating plan with enough calories and nutrients to support your body's needs when you have chronic hepatitis infection. This can help to protect your liver and prevent complications from your condition. Work with your health care provider or a diet and nutrition specialist (dietitian) to make an eating plan that meets your specific needs. What are tips for following this plan? Reading food labels  Check food labels for the amount of salt (sodium), fat, sugar, and protein. Shopping  Avoid processed foods. Cooking  Do not add extra salt to food when cooking.  Grill, boil, or bake foods instead of frying them. Do not use pots and pans made of iron.  Maintain a clean space to prepare and eat your meals. Wash your hands before and after preparing and eating your food. Meal planning  Try eating 4-6 small meals instead of 3 large meals each day.  Limit foods that are high in fat, sugar, and sodium.  Do not eat uncooked shellfish.  If you are underweight, your health care provider may recommend eating more calories. Lifestyle   Do not drink alcohol.  Avoid dehydration by drinking enough fluid to keep your urine pale yellow. Limit your fluid intake only if your health care provider tells you to do so. General information  Follow your health care provider's recommendations for how much you should get of the following nutrients each day: ? Sodium (mg/day): ________ ? Fluids (oz/day): ________ ? Protein (oz/day): ________ ? Vitamin and mineral supplements: ________________________  Take vitamin and mineral supplements only as told by your health care provider or dietitian. What foods can I eat?  Fruits Apples, bananas, pears, apricots, grapes, and cherries. Canned fruit (in juice or water). Vegetables Carrots, broccoli, cabbage,  celery, cucumbers, and potatoes. Frozen vegetables. Low-sodium canned vegetables. Grains Whole-grain bread, tortillas, cereals, and pasta. Brown rice. Oats and oatmeal. Meats and other proteins Lean meat and poultry. Fish. Tofu. Eggs. Nuts and nut butters. Beans. Dairy Low-fat yogurt. Low-fat cottage cheese. Low-fat cheese. Milkshakes. Beverages Water. Low-fat milk. 100% fruit juice. Low-sodium vegetable juice. Smoothies. Herbal tea. Condiments Low-fat mayonnaise. Low-sodium soy sauce. Sweets and desserts Low-fat cookies. Low-fat bran muffins. Fats and oils Canola oil, olive oil, corn oil, sunflower oil, peanut oil, and flaxseed oil. Other Baked chips. Whole-grain crackers. Powdered protein supplements (check label for sodium, fat, and iron content). The items listed above may not be a complete list of foods and beverages you can eat. Contact a dietitian for more options. What foods should I avoid? Fruits Raisins. Vegetables Fermented vegetables, such as sauerkraut and pickles. Canned vegetables that are high in sodium. Dark green leafy vegetables, such as spinach and kale. Grains Iron-fortified cereals and breads. Meats and other proteins Red meat. Pork. Chicken. Shellfish. Fish. Beans. Salted nuts. Salted or cured meats. Condiments Ketchup. Mustard. Barbecue sauce. Fats and oils Animal fats, such as butter, lard, or ghee. Fruit oils, such as coconut oil or avocado oil. Other Salted snacks, such as potato chips or pretzels. Canned soups. Frozen dinners. Processed foods. Multivitamins and supplements that contain iron. The items listed above may not be a complete list of foods and beverages you  should avoid. Contact a dietitian for more information. Summary  If you have chronic hepatitis, adjusting your food choices can help to protect your liver and prevent further complications.  Make adjustments such as limiting foods that are high in fat, sugar, and sodium.  Other dietary  tips include not eating uncooked shellfish, not drinking alcohol, and taking vitamin and mineral supplements only as directed by your health care provider or dietitian. This information is not intended to replace advice given to you by your health care provider. Make sure you discuss any questions you have with your health care provider. Document Revised: 05/15/2018 Document Reviewed: 01/23/2017 Elsevier Patient Education  [place].

## 2019-02-23 NOTE — Plan of Care (Signed)
  Problem: Activity: Goal: Risk for activity intolerance will decrease Outcome: Completed/Met

## 2019-02-23 NOTE — Progress Notes (Signed)
Barbara Harvey to be D/C'd Home per MD order.  Discussed prescriptions and follow up appointments with the patient. Prescriptions given to patient, medication list explained in detail. Pt verbalized understanding.  Allergies as of 02/23/2019      Reactions   Pineapple Hives, Itching   Redness      Medication List    STOP taking these medications   acetaminophen 500 MG tablet Commonly known as: TYLENOL     TAKE these medications   albuterol 108 (90 Base) MCG/ACT inhaler Commonly known as: VENTOLIN HFA Inhale 1-2 puffs into the lungs every 4 (four) hours as needed for wheezing or shortness of breath (PT DOES NOT HAVE ASTHMA OR COPD-ONLY HAD THIS FOR WHEEZING IN 2019 DUE TO SEASONAL ALLERGIES).   amLODipine 5 MG tablet Commonly known as: NORVASC Take 5 mg by mouth daily.   CITRACAL + D PO Take 1 tablet by mouth 2 (two) times daily.   fexofenadine 180 MG tablet Commonly known as: ALLEGRA Take 180 mg by mouth every morning.   fluticasone 50 MCG/ACT nasal spray Commonly known as: FLONASE Place 2 sprays into both nostrils every morning.   Magnesium Oxide 500 MG (LAX) Tabs Take 500 mg by mouth 2 (two) times daily. Phillips   montelukast 10 MG tablet Commonly known as: SINGULAIR Take 10 mg by mouth daily.   progesterone 200 MG capsule Commonly known as: PROMETRIUM Take 200 mg by mouth at bedtime.       Vitals:   02/23/19 0939 02/23/19 0956  BP: 135/76 (!) 145/84  Pulse: 77   Resp: 18   Temp: 97.7 F (36.5 C)   SpO2: 99%     Skin clean, dry and intact without evidence of skin break down, no evidence of skin tears noted. IV catheter discontinued intact. Site without signs and symptoms of complications. Dressing and pressure applied. Pt denies pain at this time. No complaints noted.  An After Visit Summary was printed and given to the patient. Patient escorted via WC, and D/C home via private auto.  Leonides Cave 02/23/2019 1:06 PM

## 2019-02-24 LAB — HEPATITIS C VRS RNA DETECT BY PCR-QUAL: Hepatitis C Vrs RNA by PCR-Qual: NEGATIVE

## 2019-02-24 LAB — EBV AB TO VIRAL CAPSID AG PNL, IGG+IGM
EBV VCA IgG: 171 U/mL — ABNORMAL HIGH (ref 0.0–17.9)
EBV VCA IgM: <36 U/mL (ref 0.0–35.9)

## 2019-02-24 LAB — HEPATITIS B E ANTIGEN: Hep B E Ag: NEGATIVE

## 2019-02-24 NOTE — Discharge Summary (Signed)
[facility] Discharge Summary   Patient: Barbara Harvey DGU:[id number]  PCP: Barbara Kansky, MD  Date of admission: 02/21/2019   Date of discharge: 02/23/2019      Discharge Diagnoses:   Principal Problem:   Abdominal pain Active Problems:   HTN (hypertension)   Abnormal LFTs   Cholelithiasis   Allergy   Admitted From: Home Disposition:  Home   Recommendations for Outpatient Follow-up:  1. PCP: Follow-up with PCP in 1 week with repeat LFT 2. Follow up LABS/TEST: Follow-up with GI  Follow-up Information    Barbara Kansky, MD. Schedule an appointment as soon as possible for a visit in 1 week(s).   Specialty: Family Medicine Why: need LFT in 2 days and may need a repeat in 4-5 days depending on the labs.  Contact information: [address] [facility] [place] [address] [phone]        Barbara Kidney, MD. Call in 4 week(s).   Specialty: Gastroenterology Why: to set up a follow up, depending on pt preference.  Contact information: [address] [place] [address] [phone]        Barbara Mood, MD. Call in 4 week(s).   Specialty: Gastroenterology Why: to set up a follow up, depending on pt preference.  Contact information: [address] [place] [address] [phone]          Diet recommendation: Cardiac diet  Activity: The patient is advised to gradually reintroduce usual activities, as tolerated  Discharge Condition: stable  Code Status: Full code   History of present illness: As per the H and P dictated on admission, "Barbara Harvey is a 64 y.o. female with medical history significant of hypertension, GERD, allergy, who presents with abdominal pain.  Patient states that her abdominal pain started Thursday night, which is located in the upper abdomen, initially 8 out of 10 severity, currently 1 out of 10 severity, pressure-like, constant, nonradiating.  She does not have nausea, vomiting,  diarrhea.  No fever or chills.  Patient states that she tried to make herself vomit, without significant help.  Patient does not have chest pain, shortness breath, cough, symptoms of UTI or unilateral weakness. "  Hospital Course:   Summary of her active problems in the hospital is as following.   Abdominal pain,  Abnormal LFTsand hepatitis cholelithiasis US-RUQ showed cholelithiasis without evidence of acute cholecystitis at this time.MRCP neg for choledocholithiasis or biliary tract obstruction.  Per GI, "This pattern of abnormal LFTs is usually seen either with ischemia, medications/toxins or acute viral hepatitis." --Hepatitis panel neg. --LFT's trended down this morning. --GI Dr. Tobi Harvey following PLAN per GI: --Check hepatitis B, C viral loads, HIV, EBV, CMV, HSV, VZV viral loads as during the window. The antibody may be negative and may have a positive viral load. --Doppler ultrasound of the hepatic vasculature was performed, which ruled out portal or hepatic vein thrombosis. --If has recurrent epigastric pains may need to consider cholecystectomy.  --repeat LFT's in 24-48 hours with her PCP and follow up with her GI physician Dr Barbara Harvey .   HTN:  -Continue home medications:Amlodipine -hydralazine prn  Hx ofAllergy: -Continue home medication   Patient was ambulatory without any assistance. On the day of the discharge the patient's vitals were stable, and no other acute medical condition were reported by patient. the patient was felt safe to be discharge at Home with no therapy needed on discharge.  Consultants: Gastroenterology  Procedures: none  Discharge Exam: General: Appear in no distress, no Rash; Oral Mucosa Clear, moist. Cardiovascular: S1 and S2 Present, no Murmur, Respiratory: normal respiratory effort, Bilateral Air entry present and no Crackles, no wheezes Abdomen: Bowel Sound present, Soft and no tenderness, no hernia Extremities: no Pedal edema, no calf  tenderness Neurology: alert and oriented to time, place, and person affect appropriate.  Filed Weights   02/21/19 1245  Weight: 82.1 kg   Vitals:   02/23/19 0939 02/23/19 0956  BP: 135/76 (!) 145/84  Pulse: 77   Resp: 18   Temp: 97.7 F (36.5 C)   SpO2: 99%     DISCHARGE MEDICATION: Allergies as of 02/23/2019      Reactions   Pineapple Hives, Itching   Redness      Medication List    STOP taking these medications   acetaminophen 500 MG tablet Commonly known as: TYLENOL     TAKE these medications   albuterol 108 (90 Base) MCG/ACT inhaler Commonly known as: VENTOLIN HFA Inhale 1-2 puffs into the lungs every 4 (four) hours as needed for wheezing or shortness of breath (PT DOES NOT HAVE ASTHMA OR COPD-ONLY HAD THIS FOR WHEEZING IN 2019 DUE TO SEASONAL ALLERGIES).   amLODipine 5 MG tablet Commonly known as: NORVASC Take 5 mg by mouth daily.   CITRACAL + D PO Take 1 tablet by mouth 2 (two) times daily.   fexofenadine 180 MG tablet Commonly known as: ALLEGRA Take 180 mg by mouth every morning.   fluticasone 50 MCG/ACT nasal spray Commonly known as: FLONASE Place 2 sprays into both nostrils every morning.   Magnesium Oxide 500 MG (LAX) Tabs Take 500 mg by mouth 2 (two) times daily. Phillips   montelukast 10 MG tablet Commonly known as: SINGULAIR Take 10 mg by mouth daily.   progesterone 200 MG capsule Commonly known as: PROMETRIUM Take 200 mg by mouth at bedtime.      Allergies  Allergen Reactions  . Pineapple Hives and Itching    Redness   Discharge Instructions    Diet - low sodium heart healthy   Complete by: As directed    Increase activity slowly   Complete by: As directed       The results of significant diagnostics from this hospitalization (including imaging, microbiology, ancillary and laboratory) are listed below for reference.    Significant Diagnostic Studies: MR ABDOMEN MRCP WO CONTRAST  Result Date: 02/21/2019 CLINICAL DATA:   64 year old female with history of 2-3 days of intermittent epigastric pain. Bloating sensation. Nausea. EXAM: MRI ABDOMEN WITHOUT CONTRAST  (INCLUDING MRCP) TECHNIQUE: Multiplanar multisequence MR imaging of the abdomen was performed. Heavily T2-weighted images of the biliary and pancreatic ducts were obtained, and three-dimensional MRCP images were rendered by post processing. COMPARISON:  No priors. FINDINGS: Comment: Today's study is limited for detection and characterization of visceral and/or vascular lesions by lack of IV gadolinium. Lower chest: Unremarkable. Hepatobiliary: 8 mm T1 hypointense, T2 hyperintense lesion in segment 3 of the liver, incompletely characterized on today's noncontrast examination, but statistically likely a small cyst. No other larger more suspicious appearing hepatic lesions. No intra or extrahepatic biliary ductal dilatation noted on MRCP images. Common bile duct measures 5 mm in the porta hepatis. No filling defect in the common bile duct to suggest choledocholithiasis. There multiple filling defects in the gallbladder, compatible with gallstones, measuring up to 1.4 cm. Gallbladder is not distended. Gallbladder wall thickness is normal. No pericholecystic fluid. Pancreas: No pancreatic mass confidently identified on today's  noncontrast examination. No pancreatic ductal dilatation noted on MRCP images. No pancreatic or peripancreatic fluid collections or inflammatory changes. Spleen:  Unremarkable. Adrenals/Urinary Tract: Bilateral kidneys and adrenal glands are normal in appearance. No hydroureteronephrosis in the visualized portions of the abdomen. Stomach/Bowel: Stomach and visualized portions of small bowel and colon are unremarkable in appearance. Vascular/Lymphatic: No aneurysm identified in the visualized portions of the abdomen. No lymphadenopathy noted in the abdomen. Other: No significant volume of ascites noted in the visualized portions of the peritoneal cavity.  Musculoskeletal: No aggressive appearing osseous lesions are noted in the visualized portions of the skeleton. IMPRESSION: 1. No acute findings are noted in the abdomen to account for the patient's symptoms on today's noncontrast examination. 2. Cholelithiasis without evidence of acute cholecystitis. 3. No evidence of choledocholithiasis or biliary tract obstruction. Electronically Signed   By: Trudie Reed M.D.   On: 02/21/2019 17:13   MR 3D Recon At Scanner  Result Date: 02/21/2019 CLINICAL DATA:  64 year old female with history of 2-3 days of intermittent epigastric pain. Bloating sensation. Nausea. EXAM: MRI ABDOMEN WITHOUT CONTRAST  (INCLUDING MRCP) TECHNIQUE: Multiplanar multisequence MR imaging of the abdomen was performed. Heavily T2-weighted images of the biliary and pancreatic ducts were obtained, and three-dimensional MRCP images were rendered by post processing. COMPARISON:  No priors. FINDINGS: Comment: Today's study is limited for detection and characterization of visceral and/or vascular lesions by lack of IV gadolinium. Lower chest: Unremarkable. Hepatobiliary: 8 mm T1 hypointense, T2 hyperintense lesion in segment 3 of the liver, incompletely characterized on today's noncontrast examination, but statistically likely a small cyst. No other larger more suspicious appearing hepatic lesions. No intra or extrahepatic biliary ductal dilatation noted on MRCP images. Common bile duct measures 5 mm in the porta hepatis. No filling defect in the common bile duct to suggest choledocholithiasis. There multiple filling defects in the gallbladder, compatible with gallstones, measuring up to 1.4 cm. Gallbladder is not distended. Gallbladder wall thickness is normal. No pericholecystic fluid. Pancreas: No pancreatic mass confidently identified on today's noncontrast examination. No pancreatic ductal dilatation noted on MRCP images. No pancreatic or peripancreatic fluid collections or inflammatory changes.  Spleen:  Unremarkable. Adrenals/Urinary Tract: Bilateral kidneys and adrenal glands are normal in appearance. No hydroureteronephrosis in the visualized portions of the abdomen. Stomach/Bowel: Stomach and visualized portions of small bowel and colon are unremarkable in appearance. Vascular/Lymphatic: No aneurysm identified in the visualized portions of the abdomen. No lymphadenopathy noted in the abdomen. Other: No significant volume of ascites noted in the visualized portions of the peritoneal cavity. Musculoskeletal: No aggressive appearing osseous lesions are noted in the visualized portions of the skeleton. IMPRESSION: 1. No acute findings are noted in the abdomen to account for the patient's symptoms on today's noncontrast examination. 2. Cholelithiasis without evidence of acute cholecystitis. 3. No evidence of choledocholithiasis or biliary tract obstruction. Electronically Signed   By: Trudie Reed M.D.   On: 02/21/2019 17:13   US LIVER DOPPLER  Result Date: 02/23/2019 CLINICAL DATA:  64 year old female with a history of epigastric pain EXAM: DUPLEX ULTRASOUND OF LIVER TECHNIQUE: Color and duplex Doppler ultrasound was performed to evaluate the hepatic in-flow and out-flow vessels. COMPARISON:  CT 02/21/2019 FINDINGS: Portal Vein Velocities Main:  32 cm/sec Right:  12 cm/sec Left:  26 cm/sec Hepatic Vein Velocities Right:  18 cm/sec Middle:  22 cm/sec Left:  19 cm/sec Hepatic Artery Velocity:  147 cm/sec Splenic Vein Velocity:  17 cm/sec Varices: Absent Ascites: Absent Spleen volume estimated 123 cc Cholelithiasis/sludge.  IMPRESSION: Unremarkable directed duplex of the hepatic vasculature. Electronically Signed   By: Corrie Mckusick D.O.   On: 02/23/2019 10:16   US Abdomen Limited RUQ  Result Date: 02/21/2019 CLINICAL DATA:  64 year old female with history of nausea and vomiting for the past 2 days. EXAM: ULTRASOUND ABDOMEN LIMITED RIGHT UPPER QUADRANT COMPARISON:  No priors. FINDINGS: Gallbladder:  Multiple echogenic foci with posterior acoustic shadowing, compatible with gallstones, measuring up to 1.3 cm in diameter. Gallbladder does not appear distended. Gallbladder wall thickness is mildly increased measuring up to 4 mm. No pericholecystic fluid. Per report from the sonographer, the patient did not exhibit a sonographic Murphy's sign on examination. Common bile duct: Diameter: 4 mm. Liver: In the left lobe of the liver there is a tiny 8 x 5 x 10 mm anechoic lesion with increased through transmission, compatible with a small cyst. Within normal limits in parenchymal echogenicity. Portal vein is patent on color Doppler imaging with normal direction of blood flow towards the liver. Other: None. IMPRESSION: 1. Cholelithiasis without evidence of acute cholecystitis at this time. 2. Tiny cyst in the left lobe of the liver incidentally noted. Electronically Signed   By: Vinnie Langton M.D.   On: 02/21/2019 13:46    Microbiology: No results found for this or any previous visit (from the past 240 hour(s)).   Labs: CBC: Recent Labs  Lab 02/21/19 1253 02/22/19 0419 02/23/19 0435  WBC 8.5 5.1 5.5  NEUTROABS 6.6  --   --   HGB 15.0 13.0 13.8  HCT 46.3* 41.0 43.7  MCV 90.3 91.1 93.2  PLT 315 244 885   Basic Metabolic Panel: Recent Labs  Lab 02/21/19 1253 02/22/19 0419 02/23/19 0435  NA 138 140 141  K 3.7 3.5 3.5  CL 99 105 106  CO2 29 28 25   GLUCOSE 108* 101* 86  BUN 14 13 20   CREATININE 0.79 0.82 0.97  CALCIUM 9.7 8.4* 8.6*  MG  --   --  2.7*   Liver Function Tests: Recent Labs  Lab 02/21/19 1253 02/22/19 0419 02/22/19 2013 02/23/19 0435  AST 2,420* 1,381* 769* 493*  ALT 1,600* 1,588* 1,250* 1,072*  ALKPHOS 179* 190* 229* 210*  BILITOT 3.1* 3.3* 1.4* 1.3*  PROT 7.7 6.6 6.8 6.8  ALBUMIN 4.1 3.6 3.5 3.5   Recent Labs  Lab 02/21/19 1253  LIPASE 56*   No results for input(s): AMMONIA in the last 168 hours. Cardiac Enzymes: Recent Labs  Lab 02/21/19 1440  CKTOTAL  35*   BNP (last 3 results) No results for input(s): BNP in the last 8760 hours. CBG: Recent Labs  Lab 02/22/19 0828 02/23/19 0810  GLUCAP 86 88    Time spent: 35 minutes  Signed:  Berle Mull  [facility] 02/23/2019 11:14 PM

## 2019-02-25 LAB — HIV-1 RNA, PCR (GRAPH) RFX/GENO EDI
HIV-1 RNA BY PCR: <20 copies/mL
HIV-1 RNA Quant, Log: UNDETERMINED log10copy/mL

## 2019-02-25 LAB — HSV DNA BY PCR (REFERENCE LAB)
HSV 1 DNA: NEGATIVE
HSV 2 DNA: NEGATIVE

## 2019-02-25 LAB — CMV DNA, QUANTITATIVE, PCR
CMV DNA Quant: NEGATIVE IU/mL
Log10 CMV Qn DNA Pl: UNDETERMINED log10 IU/mL

## 2019-02-25 LAB — VARICELLA-ZOSTER BY PCR: Varicella-Zoster, PCR: NEGATIVE

## 2019-03-19 ENCOUNTER — Other Ambulatory Visit: Payer: Self-pay

## 2019-03-19 ENCOUNTER — Encounter: Payer: Self-pay | Admitting: Gastroenterology

## 2019-03-19 ENCOUNTER — Ambulatory Visit (INDEPENDENT_AMBULATORY_CARE_PROVIDER_SITE_OTHER): Payer: BC Managed Care – PPO | Admitting: Gastroenterology

## 2019-03-19 VITALS — BP 142/84 | HR 78 | Temp 97.7°F | Ht 62.0 in | Wt 178.2 lb

## 2019-03-19 DIAGNOSIS — R109 Unspecified abdominal pain: Secondary | ICD-10-CM

## 2019-03-19 DIAGNOSIS — K8070 Calculus of gallbladder and bile duct without cholecystitis without obstruction: Secondary | ICD-10-CM

## 2019-03-19 DIAGNOSIS — R1013 Epigastric pain: Secondary | ICD-10-CM | POA: Diagnosis not present

## 2019-03-19 DIAGNOSIS — R7989 Other specified abnormal findings of blood chemistry: Secondary | ICD-10-CM

## 2019-03-19 DIAGNOSIS — R945 Abnormal results of liver function studies: Secondary | ICD-10-CM

## 2019-03-19 NOTE — Addendum Note (Signed)
Addended by: Daisy Blossom on: 03/19/2019 11:25 AM   Modules accepted: Orders

## 2019-03-19 NOTE — Progress Notes (Signed)
Barbara Bellows MD, MRCP(U.K) [address]  [place]  [place], [place] [address]  Main: [phone]  Fax: [phone]   Primary Care Physician: Minda Ditto, MD  Primary Gastroenterologist:  Dr. Harland German follow-up  HPI: Barbara Harvey is a 64 y.o. female    Summary of history :  I was consulted to see her when she was admitted to the hospital on 02/21/2019.  At that time she was found to have elevated LFTs predominantly transaminases. Right upper quadrant ultrasound showed cholelithiasis without evidence of cholecystitis.  Subsequently MRCP was performed.  No acute findings.  No choledocholithiasis or biliary tract obstruction.On admission hemoglobin was 15 g with a white cell count of 8.5 creatinine of 0.79.  AST of 2400, ALT thousand 600 and total bilirubin of 3.1.  Alkaline phosphatase of 179.  Lipase was normal at 56.  INR was normal at 0.9.  Acute hepatitis panel was negative.  Acetaminophen levels were in normal range.  CK levels were not elevated. GGT was 239.  TSH was normal testing for Covid was negative. HIV negative.  Transaminases rapidly improved.  She did have a 2 day history of epigastric discomfort and bloating, on and off , non radiating , presently resolved, normal bowel movements. No alcohol, herbal supplements.  No new medications started . Has taken 2 tyelnol 1-2 days. Shingles vaccines 2 months back with rigors. , blisters on her arm. No covid contacts. No sick contacts, she lives alone. Works in an office by herself. No diarrhea or vomiting.   Interval history 02/23/2019-03/19/2019  03/02/2019: Liver function tests were rechecked and significantly improved with AST of 25, ALT of 150 and alkaline phosphatase of 124.  Hepatic Function Latest Ref Rng & Units 02/23/2019 02/22/2019 02/22/2019  Total Protein 6.5 - 8.1 g/dL 6.8 6.8 6.6  Albumin 3.5 - 5.0 g/dL 3.5 3.5 3.6  AST 15 - 41 U/L 493(H) 769(H) 1,381(H)  ALT 0 - 44 U/L 1,072(H) 1,250(H)  1,588(H)  Alk Phosphatase 38 - 126 U/L 210(H) 229(H) 190(H)  Total Bilirubin 0.3 - 1.2 mg/dL 1.3(H) 1.4(H) 3.3(H)  Bilirubin, Direct 0.0 - 0.2 mg/dL - 0.5(H) -   She is doing well since her last hospitalization.  She clearly states that on both episodes when she had abdominal pain in the Epigastrium it was preceded by eating some ribs and a fatty meal.  Her father had issues with her gallbladder.  She has not had any similar problem in the past.  Occasionally she has had some indigestion after her recent hospitalization.  Not severe.  Current Outpatient Medications  Medication Sig Dispense Refill   albuterol (VENTOLIN HFA) 108 (90 Base) MCG/ACT inhaler Inhale 1-2 puffs into the lungs every 4 (four) hours as needed for wheezing or shortness of breath (PT DOES NOT HAVE ASTHMA OR COPD-ONLY HAD THIS FOR WHEEZING IN 2019 DUE TO SEASONAL ALLERGIES).      amLODipine (NORVASC) 5 MG tablet Take 5 mg by mouth daily.     Calcium Citrate-Vitamin D (CITRACAL + D PO) Take 1 tablet by mouth 2 (two) times daily.     fexofenadine (ALLEGRA) 180 MG tablet Take 180 mg by mouth every morning.      fluticasone (FLONASE) 50 MCG/ACT nasal spray Place 2 sprays into both nostrils every morning.      Magnesium Oxide 500 MG (LAX) TABS Take 500 mg by mouth 2 (two) times daily. [place]     montelukast (SINGULAIR) 10 MG tablet Take 10  mg by mouth daily.     progesterone (PROMETRIUM) 200 MG capsule Take 200 mg by mouth at bedtime.      No current facility-administered medications for this visit.    Allergies as of 03/19/2019 - Review Complete 02/21/2019  Allergen Reaction Noted   Pineapple Hives and Itching 09/15/2018    ROS:  General: Negative for anorexia, weight loss, fever, chills, fatigue, weakness. ENT: Negative for hoarseness, difficulty swallowing , nasal congestion. CV: Negative for chest pain, angina, palpitations, dyspnea on exertion, peripheral edema.  Respiratory: Negative for dyspnea at rest,  dyspnea on exertion, cough, sputum, wheezing.  GI: See history of present illness. GU:  Negative for dysuria, hematuria, urinary incontinence, urinary frequency, nocturnal urination.  Endo: Negative for unusual weight change.    Physical Examination:   There were no vitals taken for this visit.  General: Well-nourished, well-developed in no acute distress.  Eyes: No icterus. Conjunctivae pink. Mouth: Oropharyngeal mucosa moist and pink , no lesions erythema or exudate. Lungs: Clear to auscultation bilaterally. Non-labored. Heart: Regular rate and rhythm, no murmurs rubs or gallops.  Abdomen: Bowel sounds are normal, nontender, nondistended, no hepatosplenomegaly or masses, no abdominal bruits or hernia , no rebound or guarding.   Extremities: No lower extremity edema. No clubbing or deformities. Neuro: Alert and oriented x 3.  Grossly intact. Skin: Warm and dry, no jaundice.   Psych: Alert and cooperative, normal mood and affect.   Imaging Studies: MR ABDOMEN MRCP WO CONTRAST  Result Date: 02/21/2019 CLINICAL DATA:  64 year old female with history of 2-3 days of intermittent epigastric pain. Bloating sensation. Nausea. EXAM: MRI ABDOMEN WITHOUT CONTRAST  (INCLUDING MRCP) TECHNIQUE: Multiplanar multisequence MR imaging of the abdomen was performed. Heavily T2-weighted images of the biliary and pancreatic ducts were obtained, and three-dimensional MRCP images were rendered by post processing. COMPARISON:  No priors. FINDINGS: Comment: Today's study is limited for detection and characterization of visceral and/or vascular lesions by lack of IV gadolinium. Lower chest: Unremarkable. Hepatobiliary: 8 mm T1 hypointense, T2 hyperintense lesion in segment 3 of the liver, incompletely characterized on today's noncontrast examination, but statistically likely a small cyst. No other larger more suspicious appearing hepatic lesions. No intra or extrahepatic biliary ductal dilatation noted on MRCP images.  Common bile duct measures 5 mm in the porta hepatis. No filling defect in the common bile duct to suggest choledocholithiasis. There multiple filling defects in the gallbladder, compatible with gallstones, measuring up to 1.4 cm. Gallbladder is not distended. Gallbladder wall thickness is normal. No pericholecystic fluid. Pancreas: No pancreatic mass confidently identified on today's noncontrast examination. No pancreatic ductal dilatation noted on MRCP images. No pancreatic or peripancreatic fluid collections or inflammatory changes. Spleen:  Unremarkable. Adrenals/Urinary Tract: Bilateral kidneys and adrenal glands are normal in appearance. No hydroureteronephrosis in the visualized portions of the abdomen. Stomach/Bowel: Stomach and visualized portions of small bowel and colon are unremarkable in appearance. Vascular/Lymphatic: No aneurysm identified in the visualized portions of the abdomen. No lymphadenopathy noted in the abdomen. Other: No significant volume of ascites noted in the visualized portions of the peritoneal cavity. Musculoskeletal: No aggressive appearing osseous lesions are noted in the visualized portions of the skeleton. IMPRESSION: 1. No acute findings are noted in the abdomen to account for the patient's symptoms on today's noncontrast examination. 2. Cholelithiasis without evidence of acute cholecystitis. 3. No evidence of choledocholithiasis or biliary tract obstruction. Electronically Signed   By: Vinnie Langton M.D.   On: 02/21/2019 17:13   MR 3D Recon  At Scanner  Result Date: 02/21/2019 CLINICAL DATA:  64 year old female with history of 2-3 days of intermittent epigastric pain. Bloating sensation. Nausea. EXAM: MRI ABDOMEN WITHOUT CONTRAST  (INCLUDING MRCP) TECHNIQUE: Multiplanar multisequence MR imaging of the abdomen was performed. Heavily T2-weighted images of the biliary and pancreatic ducts were obtained, and three-dimensional MRCP images were rendered by post processing.  COMPARISON:  No priors. FINDINGS: Comment: Today's study is limited for detection and characterization of visceral and/or vascular lesions by lack of IV gadolinium. Lower chest: Unremarkable. Hepatobiliary: 8 mm T1 hypointense, T2 hyperintense lesion in segment 3 of the liver, incompletely characterized on today's noncontrast examination, but statistically likely a small cyst. No other larger more suspicious appearing hepatic lesions. No intra or extrahepatic biliary ductal dilatation noted on MRCP images. Common bile duct measures 5 mm in the porta hepatis. No filling defect in the common bile duct to suggest choledocholithiasis. There multiple filling defects in the gallbladder, compatible with gallstones, measuring up to 1.4 cm. Gallbladder is not distended. Gallbladder wall thickness is normal. No pericholecystic fluid. Pancreas: No pancreatic mass confidently identified on today's noncontrast examination. No pancreatic ductal dilatation noted on MRCP images. No pancreatic or peripancreatic fluid collections or inflammatory changes. Spleen:  Unremarkable. Adrenals/Urinary Tract: Bilateral kidneys and adrenal glands are normal in appearance. No hydroureteronephrosis in the visualized portions of the abdomen. Stomach/Bowel: Stomach and visualized portions of small bowel and colon are unremarkable in appearance. Vascular/Lymphatic: No aneurysm identified in the visualized portions of the abdomen. No lymphadenopathy noted in the abdomen. Other: No significant volume of ascites noted in the visualized portions of the peritoneal cavity. Musculoskeletal: No aggressive appearing osseous lesions are noted in the visualized portions of the skeleton. IMPRESSION: 1. No acute findings are noted in the abdomen to account for the patient's symptoms on today's noncontrast examination. 2. Cholelithiasis without evidence of acute cholecystitis. 3. No evidence of choledocholithiasis or biliary tract obstruction. Electronically  Signed   By: Vinnie Langton M.D.   On: 02/21/2019 17:13   US LIVER DOPPLER  Result Date: 02/23/2019 CLINICAL DATA:  64 year old female with a history of epigastric pain EXAM: DUPLEX ULTRASOUND OF LIVER TECHNIQUE: Color and duplex Doppler ultrasound was performed to evaluate the hepatic in-flow and out-flow vessels. COMPARISON:  CT 02/21/2019 FINDINGS: Portal Vein Velocities Main:  32 cm/sec Right:  12 cm/sec Left:  26 cm/sec Hepatic Vein Velocities Right:  18 cm/sec Middle:  22 cm/sec Left:  19 cm/sec Hepatic Artery Velocity:  147 cm/sec Splenic Vein Velocity:  17 cm/sec Varices: Absent Ascites: Absent Spleen volume estimated 123 cc Cholelithiasis/sludge. IMPRESSION: Unremarkable directed duplex of the hepatic vasculature. Electronically Signed   By: Corrie Mckusick D.O.   On: 02/23/2019 10:16   US Abdomen Limited RUQ  Result Date: 02/21/2019 CLINICAL DATA:  64 year old female with history of nausea and vomiting for the past 2 days. EXAM: ULTRASOUND ABDOMEN LIMITED RIGHT UPPER QUADRANT COMPARISON:  No priors. FINDINGS: Gallbladder: Multiple echogenic foci with posterior acoustic shadowing, compatible with gallstones, measuring up to 1.3 cm in diameter. Gallbladder does not appear distended. Gallbladder wall thickness is mildly increased measuring up to 4 mm. No pericholecystic fluid. Per report from the sonographer, the patient did not exhibit a sonographic Murphy's sign on examination. Common bile duct: Diameter: 4 mm. Liver: In the left lobe of the liver there is a tiny 8 x 5 x 10 mm anechoic lesion with increased through transmission, compatible with a small cyst. Within normal limits in parenchymal echogenicity. Portal vein is patent  on color Doppler imaging with normal direction of blood flow towards the liver. Other: None. IMPRESSION: 1. Cholelithiasis without evidence of acute cholecystitis at this time. 2. Tiny cyst in the left lobe of the liver incidentally noted. Electronically Signed   By: Vinnie Langton M.D.   On: 02/21/2019 13:46    Assessment and Plan:   KINGSLEE DOWSE is a 64 y.o. y/o female is here today for hospital follow-up when she was admitted with abdominal pain and features of acute hepatitis.  The pattern of elevated transaminases he is usually seen associated with ischemia, medications/toxins or acute viral hepatitis.  Normal Doppler ultrasound of the liver. MRCP demonstrated only cholelithiasis and no choledocholithiasis.  Test for acute viral hepatitis were negative.  INR was within normal limits.  Subsequently the LFTs have almost normalized.  Plan             1.  This is the second episode of epigastric discomfort she has had ? Biliary colic- usually if a stone is passed transaminases do not rise this high.  I will refer her to see Dr.Pabon there he can can listen to her history and determine if the 2 episodes of abdominal pain preceded by eating a heavy meal were episodes of biliary colic and if her gallbladder needs to come out.  I was advised her for the sense of indigestion to commence on some Pepcid.  If required down the road we can always perform an EGD.                   2.  Recheck LFTs and GGT    Dr Barbara Bellows  MD,MRCP [facility]) Follow up in 3 months telephone visit

## 2019-03-20 ENCOUNTER — Encounter: Payer: Self-pay | Admitting: Gastroenterology

## 2019-03-20 ENCOUNTER — Telehealth: Payer: Self-pay

## 2019-03-20 LAB — HEPATIC FUNCTION PANEL
ALT: 15 IU/L (ref 0–32)
AST: 14 IU/L (ref 0–40)
Albumin: 4.4 g/dL (ref 3.8–4.8)
Alkaline Phosphatase: 111 IU/L (ref 39–117)
Bilirubin Total: 0.3 mg/dL (ref 0.0–1.2)
Bilirubin, Direct: 0.11 mg/dL (ref 0.00–0.40)
Total Protein: 6.8 g/dL (ref 6.0–8.5)

## 2019-03-20 LAB — GAMMA GT: GGT: 51 IU/L (ref 0–60)

## 2019-03-20 NOTE — Telephone Encounter (Signed)
Spoke with pt and informed her of lab results  

## 2019-03-20 NOTE — Telephone Encounter (Signed)
-----   Message from Denman George, CMA sent at 03/20/2019  9:40 AM EST -----  ----- Message ----- From: [name], MD Sent: 03/20/2019   8:44 AM EST To: Denman George, CMA  Morrie Sheldon inform LFT's completely normal now

## 2019-03-25 ENCOUNTER — Ambulatory Visit (INDEPENDENT_AMBULATORY_CARE_PROVIDER_SITE_OTHER): Payer: BC Managed Care – PPO | Admitting: Surgery

## 2019-03-25 ENCOUNTER — Encounter: Payer: Self-pay | Admitting: Surgery

## 2019-03-25 ENCOUNTER — Other Ambulatory Visit: Payer: Self-pay

## 2019-03-25 VITALS — BP 136/87 | HR 83 | Temp 96.6°F | Resp 14 | Ht 62.0 in | Wt 178.6 lb

## 2019-03-25 DIAGNOSIS — K8020 Calculus of gallbladder without cholecystitis without obstruction: Secondary | ICD-10-CM

## 2019-03-25 NOTE — Patient Instructions (Addendum)
Our surgery scheduler will call you to schedule your surgery within 24-48 hours. Please have the Blue surgery Sheet available when speaking with her.    Laparoscopic Cholecystectomy Laparoscopic cholecystectomy is surgery to remove the gallbladder. The gallbladder is a pear-shaped organ that lies beneath the liver on the right side of the body. The gallbladder stores bile, which is a fluid that helps the body to digest fats. Cholecystectomy is often done for inflammation of the gallbladder (cholecystitis). This condition is usually caused by a buildup of gallstones (cholelithiasis) in the gallbladder. Gallstones can block the flow of bile, which can result in inflammation and pain. In severe cases, emergency surgery may be required. This procedure is done though small incisions in your abdomen (laparoscopic surgery). A thin scope with a camera (laparoscope) is inserted through one incision. Thin surgical instruments are inserted through the other incisions. In some cases, a laparoscopic procedure may be turned into a type of surgery that is done through a larger incision (open surgery). Tell a health care provider about:  Any allergies you have.  All medicines you are taking, including vitamins, herbs, eye drops, creams, and over-the-counter medicines.  Any problems you or family members have had with anesthetic medicines.  Any blood disorders you have.  Any surgeries you have had.  Any medical conditions you have.  Whether you are pregnant or may be pregnant. What are the risks? Generally, this is a safe procedure. However, problems may occur, including:  Infection.  Bleeding.  Allergic reactions to medicines.  Damage to other structures or organs.  A stone remaining in the common bile duct. The common bile duct carries bile from the gallbladder into the small intestine.  A bile leak from the cyst duct that is clipped when your gallbladder is removed. What happens before the  procedure? Staying hydrated Follow instructions from your health care provider about hydration, which may include:  Up to 2 hours before the procedure - you may continue to drink clear liquids, such as water, clear fruit juice, black coffee, and plain tea. Eating and drinking restrictions Follow instructions from your health care provider about eating and drinking, which may include:  8 hours before the procedure - stop eating heavy meals or foods such as meat, fried foods, or fatty foods.  6 hours before the procedure - stop eating light meals or foods, such as toast or cereal.  6 hours before the procedure - stop drinking milk or drinks that contain milk.  2 hours before the procedure - stop drinking clear liquids. Medicines  Ask your health care provider about: ? Changing or stopping your regular medicines. This is especially important if you are taking diabetes medicines or blood thinners. ? Taking medicines such as aspirin and ibuprofen. These medicines can thin your blood. Do not take these medicines before your procedure if your health care provider instructs you not to.  You may be given antibiotic medicine to help prevent infection. General instructions  Let your health care provider know if you develop a cold or an infection before surgery.  Plan to have someone take you home from the hospital or clinic.  Ask your health care provider how your surgical site will be marked or identified. What happens during the procedure?   To reduce your risk of infection: ? Your health care team will wash or sanitize their hands. ? Your skin will be washed with soap. ? Hair may be removed from the surgical area.  An IV tube may be  inserted into one of your veins.  You will be given one or more of the following: ? A medicine to help you relax (sedative). ? A medicine to make you fall asleep (general anesthetic).  A breathing tube will be placed in your mouth.  Your surgeon will  make several small cuts (incisions) in your abdomen.  The laparoscope will be inserted through one of the small incisions. The camera on the laparoscope will send images to a TV screen (monitor) in the operating room. This lets your surgeon see inside your abdomen.  Air-like gas will be pumped into your abdomen. This will expand your abdomen to give the surgeon more room to perform the surgery.  Other tools that are needed for the procedure will be inserted through the other incisions. The gallbladder will be removed through one of the incisions.  Your common bile duct may be examined. If stones are found in the common bile duct, they may be removed.  After your gallbladder has been removed, the incisions will be closed with stitches (sutures), staples, or skin glue.  Your incisions may be covered with a bandage (dressing). The procedure may vary among health care providers and hospitals. What happens after the procedure?  Your blood pressure, heart rate, breathing rate, and blood oxygen level will be monitored until the medicines you were given have worn off.  You will be given medicines as needed to control your pain.  Do not drive for 24 hours if you were given a sedative. This information is not intended to replace advice given to you by your health care provider. Make sure you discuss any questions you have with your health care provider. Document Revised: 01/04/2017 Document Reviewed: 07/11/2015 Elsevier Patient Education  2020 [employer].  Cholelithiasis  Cholelithiasis is also called "gallstones." It is a kind of gallbladder disease. The gallbladder is an organ that stores a liquid (bile) that helps you digest fat. Gallstones may not cause symptoms (may be silent gallstones) until they cause a blockage, and then they can cause pain (gallbladder attack). Follow these instructions at home:  Take over-the-counter and prescription medicines only as told by your doctor.  Stay at  a healthy weight.  Eat healthy foods. This includes: ? Eating fewer fatty foods, like fried foods. ? Eating fewer refined carbs (refined carbohydrates). Refined carbs are breads and grains that are highly processed, like white bread and white rice. Instead, choose whole grains like whole-wheat bread and brown rice. ? Eating more fiber. Almonds, fresh fruit, and beans are healthy sources of fiber.  Keep all follow-up visits as told by your doctor. This is important. Contact a doctor if:  You have sudden pain in the upper right side of your belly (abdomen). Pain might spread to your right shoulder or your chest. This may be a sign of a gallbladder attack.  You feel sick to your stomach (are nauseous).  You throw up (vomit).  You have been diagnosed with gallstones that have no symptoms and you get: ? Belly pain. ? Discomfort, burning, or fullness in the upper part of your belly (indigestion). Get help right away if:  You have sudden pain in the upper right side of your belly, and it lasts for more than 2 hours.  You have belly pain that lasts for more than 5 hours.  You have a fever or chills.  You keep feeling sick to your stomach or you keep throwing up.  Your skin or the whites of your eyes turn  yellow (jaundice).  You have dark-colored pee (urine).  You have light-colored poop (stool). Summary  Cholelithiasis is also called "gallstones."  The gallbladder is an organ that stores a liquid (bile) that helps you digest fat.  Silent gallstones are gallstones that do not cause symptoms.  A gallbladder attack may cause sudden pain in the upper right side of your belly. Pain might spread to your right shoulder or your chest. If this happens, contact your doctor.  If you have sudden pain in the upper right side of your belly that lasts for more than 2 hours, get help right away. This information is not intended to replace advice given to you by your health care provider. Make  sure you discuss any questions you have with your health care provider. Document Revised: 01/04/2017 Document Reviewed: 10/09/2015 Elsevier Patient Education  [place] If you have a gallbladder condition, you may have trouble digesting fats. Eating a low-fat diet can help reduce your symptoms, and may be helpful before and after having surgery to remove your gallbladder (cholecystectomy). Your health care provider may recommend that you work with a diet and nutrition specialist (dietitian) to help you reduce the amount of fat in your diet. What are tips for following this plan? General guidelines  Limit your fat intake to less than 30% of your total daily calories. If you eat around 1,800 calories each day, this is less than 60 grams (g) of fat per day.  Fat is an important part of a healthy diet. Eating a low-fat diet can make it hard to maintain a healthy body weight. Ask your dietitian how much fat, calories, and other nutrients you need each day.  Eat small, frequent meals throughout the day instead of three large meals.  Drink at least 8-10 cups of fluid a day. Drink enough fluid to keep your urine clear or pale yellow.  Limit alcohol intake to no more than 1 drink a day for nonpregnant women and 2 drinks a day for men. One drink equals 12 oz of beer, 5 oz of wine, or 1 oz of hard liquor. Reading food labels  Check Nutrition Facts on food labels for the amount of fat per serving. Choose foods with less than 3 grams of fat per serving. Shopping  Choose nonfat and low-fat healthy foods. Look for the words "nonfat," "low fat," or "fat free."  Avoid buying processed or prepackaged foods. Cooking  Cook using low-fat methods, such as baking, broiling, grilling, or boiling.  Cook with small amounts of healthy fats, such as olive oil, grapeseed oil, canola oil, or sunflower oil. What foods are recommended?   All fresh, frozen, or canned fruits and  vegetables.  Whole grains.  Low-fat or non-fat (skim) milk and yogurt.  Lean meat, skinless poultry, fish, eggs, and beans.  Low-fat protein supplement powders or drinks.  Spices and herbs. What foods are not recommended?  High-fat foods. These include baked goods, fast food, fatty cuts of meat, ice cream, french toast, sweet rolls, pizza, cheese bread, foods covered with butter, creamy sauces, or cheese.  Fried foods. These include french fries, tempura, battered fish, breaded chicken, fried breads, and sweets.  Foods with strong odors.  Foods that cause bloating and gas. Summary  A low-fat diet can be helpful if you have a gallbladder condition, or before and after gallbladder surgery.  Limit your fat intake to less than 30% of your total daily calories. This is about 60 g of fat  if you eat 1,800 calories each day.  Eat small, frequent meals throughout the day instead of three large meals. This information is not intended to replace advice given to you by your health care provider. Make sure you discuss any questions you have with your health care provider. Document Revised: 05/15/2018 Document Reviewed: 03/01/2016 Elsevier Patient Education  [place].

## 2019-03-26 ENCOUNTER — Encounter: Payer: Self-pay | Admitting: Surgery

## 2019-03-26 NOTE — H&P (View-Only) (Signed)
Patient ID: Barbara Harvey, female   DOB: 1955/04/21, 64 y.o.   MRN: [id number]  HPI Barbara Harvey is a 64 y.o. female seen in consultation at the request of Dr. Vicente Males.  3 weeks ago she had a recent visit to the emergency room that day she developed some epigastric pain bloating.  The pain was intermittent and dull in nature.  It subsided after a couple of days.  She denies any fevers or any chills.  The pain has no specific alleviating or aggravating factors.  Part of the work-up in the emergency room revealed evidence of transaminitis with AST 2400 ALT 1600 and a total bilirubin of 3.1 and an alkaline phosphatase of 179.  This prompted an ultrasound and consequently MRCP that  I have personally reviewed showing evidence of cholelithiasis without choledocholithiasis.  There was no evidence of biliary structure or any masses.  He did have a repeat LFTs which show significant improvement and resolution.  She denies any history of alcohol intake.  She is able to perform more than 4 METS of activity without any shortness of breath or chest pain.  Recent labs from a week ago completely normalization of the LFTs. He did have a history of parathyroidectomy secondary to hyperparathyroidism. Currently she is pain-free and symptom-free.  Reports some bloating.  HPI  Past Medical History:  Diagnosis Date  . Arthritis   . Family history of adverse reaction to anesthesia    dad-n/v   . GERD (gastroesophageal reflux disease)    occ-gas x  . Heart murmur    asymptomatic  . Thyroid nodule     Past Surgical History:  Procedure Laterality Date  . COLONOSCOPY     x3  . DILATATION & CURETTAGE/HYSTEROSCOPY WITH MYOSURE N/A 10/03/2018   Procedure: [place] RESECTION OF POLYP;  Surgeon: Schermerhorn, Gwen Her, MD;  Location: [facility] ORS;  Service: Gynecology;  Laterality: N/A;  . MOUTH SURGERY    . PARATHYROIDECTOMY    . TONSILLECTOMY     age 41    Family History   Problem Relation Age of Onset  . Stroke Mother   . Hypertension Father   . Diabetes Mellitus II Father     Social History Social History   Tobacco Use  . Smoking status: Never Smoker  . Smokeless tobacco: Never Used  Substance Use Topics  . Alcohol use: Yes    Comment: rare   . Drug use: Never    Allergies  Allergen Reactions  . Pineapple Hives and Itching    Redness    Current Outpatient Medications  Medication Sig Dispense Refill  . albuterol (VENTOLIN HFA) 108 (90 Base) MCG/ACT inhaler Inhale 1-2 puffs into the lungs every 4 (four) hours as needed for wheezing or shortness of breath (PT DOES NOT HAVE ASTHMA OR COPD-ONLY HAD THIS FOR WHEEZING IN 2019 DUE TO SEASONAL ALLERGIES).     Marland Kitchen amLODipine (NORVASC) 5 MG tablet Take 5 mg by mouth daily.    . Calcium Citrate-Vitamin D (CITRACAL + D PO) Take 1 tablet by mouth 2 (two) times daily.    . fluticasone (FLONASE) 50 MCG/ACT nasal spray Place 2 sprays into both nostrils every morning.     . Magnesium Oxide 500 MG (LAX) TABS Take 500 mg by mouth 2 (two) times daily. Hardin Negus    . montelukast (SINGULAIR) 10 MG tablet Take 10 mg by mouth daily.     No current facility-administered medications for this visit.  Review of Systems Full ROS  was asked and was negative except for the information on the HPI  Physical Exam Blood pressure 136/87, pulse 83, temperature (!) 96.6 F (35.9 C), temperature source Temporal, resp. rate 14, height 5\' 2"  (1.575 m), weight 178 lb 9.6 oz (81 kg), SpO2 98 %. CONSTITUTIONAL: NAD EYES: Pupils are equal, round, and reactive to light, Sclera are non-icteric. EARS, NOSE, MOUTH AND THROAT: Wearing a mask . Hearing is intact to voice. LYMPH NODES:  Lymph nodes in the neck are normal. RESPIRATORY:  Lungs are clear. There is normal respiratory effort, with equal breath sounds bilaterally, and without pathologic use of accessory muscles. CARDIOVASCULAR: Heart is regular without murmurs, gallops, or  rubs. GI: The abdomen is  soft, nontender, and nondistended. There are no palpable masses. There is no hepatosplenomegaly. There are normal bowel sounds in all quadrants. GU: Rectal deferred.   MUSCULOSKELETAL: Normal muscle strength and tone. No cyanosis or edema.   SKIN: Turgor is good and there are no pathologic skin lesions or ulcers. NEUROLOGIC: Motor and sensation is grossly normal. Cranial nerves are grossly intact. PSYCH:  Oriented to person, place and time. Affect is normal.  Data Reviewed  I have personally reviewed the patient's imaging, laboratory findings and medical records.    Assessment/Plan 64 year old female with history consistent with biliary colic and symptomatic cholelithiasis.  Transaminitis might be related to primary hepatic injury versus transient choledocholithiasis.  Patient now with normalization of LFTs. Cussed with the patient in detail about the rationale and recommendation for cholecystectomy.  I do think that she is a good candidate for robotic approach. I discussed the procedure in detail.  The patient was given 68.  We discussed the risks and benefits of a laparoscopic cholecystectomy and possible cholangiogram including, but not limited to bleeding, infection, injury to surrounding structures such as the intestine or liver, bile leak, retained gallstones, need to convert to an open procedure, prolonged diarrhea, blood clots such as  DVT, common bile duct injury, anesthesia risks, and possible need for additional procedures.  The likelihood of improvement in symptoms and return to the patient's normal status is good. We discussed the typical post-operative recovery course. Copy of this report was sent to the referring provider   Agricultural engineer, MD FACS General Surgeon 03/26/2019, 2:28 PM

## 2019-03-26 NOTE — Progress Notes (Signed)
Patient ID: Barbara Harvey, female   DOB: 09/27/1955, 64 y.o.   MRN: [id number]  HPI Barbara Harvey is a 64 y.o. female seen in consultation at the request of Dr. Anna.  3 weeks ago she had a recent visit to the emergency room that day she developed some epigastric pain bloating.  The pain was intermittent and dull in nature.  It subsided after a couple of days.  She denies any fevers or any chills.  The pain has no specific alleviating or aggravating factors.  Part of the work-up in the emergency room revealed evidence of transaminitis with AST 2400 ALT 1600 and a total bilirubin of 3.1 and an alkaline phosphatase of 179.  This prompted an ultrasound and consequently MRCP that  I have personally reviewed showing evidence of cholelithiasis without choledocholithiasis.  There was no evidence of biliary structure or any masses.  He did have a repeat LFTs which show significant improvement and resolution.  She denies any history of alcohol intake.  She is able to perform more than 4 METS of activity without any shortness of breath or chest pain.  Recent labs from a week ago completely normalization of the LFTs. He did have a history of parathyroidectomy secondary to hyperparathyroidism. Currently she is pain-free and symptom-free.  Reports some bloating.  HPI  Past Medical History:  Diagnosis Date  . Arthritis   . Family history of adverse reaction to anesthesia    dad-n/v   . GERD (gastroesophageal reflux disease)    occ-gas x  . Heart murmur    asymptomatic  . Thyroid nodule     Past Surgical History:  Procedure Laterality Date  . COLONOSCOPY     x3  . DILATATION & CURETTAGE/HYSTEROSCOPY WITH MYOSURE N/A 10/03/2018   Procedure: FRACTIONAL DILATATION & CURETTAGE/HYSTEROSCOPY WITH MYOSURE RESECTION OF POLYP;  Surgeon: Schermerhorn, Thomas J, MD;  Location: [facility] ORS;  Service: Gynecology;  Laterality: N/A;  . MOUTH SURGERY    . PARATHYROIDECTOMY    . TONSILLECTOMY     age 7    Family History   Problem Relation Age of Onset  . Stroke Mother   . Hypertension Father   . Diabetes Mellitus II Father     Social History Social History   Tobacco Use  . Smoking status: Never Smoker  . Smokeless tobacco: Never Used  Substance Use Topics  . Alcohol use: Yes    Comment: rare   . Drug use: Never    Allergies  Allergen Reactions  . Pineapple Hives and Itching    Redness    Current Outpatient Medications  Medication Sig Dispense Refill  . albuterol (VENTOLIN HFA) 108 (90 Base) MCG/ACT inhaler Inhale 1-2 puffs into the lungs every 4 (four) hours as needed for wheezing or shortness of breath (PT DOES NOT HAVE ASTHMA OR COPD-ONLY HAD THIS FOR WHEEZING IN 2019 DUE TO SEASONAL ALLERGIES).     . amLODipine (NORVASC) 5 MG tablet Take 5 mg by mouth daily.    . Calcium Citrate-Vitamin D (CITRACAL + D PO) Take 1 tablet by mouth 2 (two) times daily.    . fluticasone (FLONASE) 50 MCG/ACT nasal spray Place 2 sprays into both nostrils every morning.     . Magnesium Oxide 500 MG (LAX) TABS Take 500 mg by mouth 2 (two) times daily. Phillips    . montelukast (SINGULAIR) 10 MG tablet Take 10 mg by mouth daily.     No current facility-administered medications for this visit.       Review of Systems Full ROS  was asked and was negative except for the information on the HPI  Physical Exam Blood pressure 136/87, pulse 83, temperature (!) 96.6 F (35.9 C), temperature source Temporal, resp. rate 14, height 5\' 2"  (1.575 m), weight 178 lb 9.6 oz (81 kg), SpO2 98 %. CONSTITUTIONAL: NAD EYES: Pupils are equal, round, and reactive to light, Sclera are non-icteric. EARS, NOSE, MOUTH AND THROAT: Wearing a mask . Hearing is intact to voice. LYMPH NODES:  Lymph nodes in the neck are normal. RESPIRATORY:  Lungs are clear. There is normal respiratory effort, with equal breath sounds bilaterally, and without pathologic use of accessory muscles. CARDIOVASCULAR: Heart is regular without murmurs, gallops, or  rubs. GI: The abdomen is  soft, nontender, and nondistended. There are no palpable masses. There is no hepatosplenomegaly. There are normal bowel sounds in all quadrants. GU: Rectal deferred.   MUSCULOSKELETAL: Normal muscle strength and tone. No cyanosis or edema.   SKIN: Turgor is good and there are no pathologic skin lesions or ulcers. NEUROLOGIC: Motor and sensation is grossly normal. Cranial nerves are grossly intact. PSYCH:  Oriented to person, place and time. Affect is normal.  Data Reviewed  I have personally reviewed the patient's imaging, laboratory findings and medical records.    Assessment/Plan 64 year old female with history consistent with biliary colic and symptomatic cholelithiasis.  Transaminitis might be related to primary hepatic injury versus transient choledocholithiasis.  Patient now with normalization of LFTs. Cussed with the patient in detail about the rationale and recommendation for cholecystectomy.  I do think that she is a good candidate for robotic approach. I discussed the procedure in detail.  The patient was given 68.  We discussed the risks and benefits of a laparoscopic cholecystectomy and possible cholangiogram including, but not limited to bleeding, infection, injury to surrounding structures such as the intestine or liver, bile leak, retained gallstones, need to convert to an open procedure, prolonged diarrhea, blood clots such as  DVT, common bile duct injury, anesthesia risks, and possible need for additional procedures.  The likelihood of improvement in symptoms and return to the patient's normal status is good. We discussed the typical post-operative recovery course. Copy of this report was sent to the referring provider   Agricultural engineer, MD FACS General Surgeon 03/26/2019, 2:28 PM

## 2019-03-30 ENCOUNTER — Telehealth: Payer: Self-pay | Admitting: Surgery

## 2019-03-30 NOTE — Telephone Encounter (Signed)
Pt has been advised of pre admission date/time, Covid Testing date and Surgery date.  Surgery Date: 04/03/19 Preadmission Testing Date: 03/31/19 Covid Testing Date: 04/01/19 - patient advised to go to [facility] ([address] [facility])  Patient has been made aware to call [phone], between 1-3:00pm the day before surgery, to find out what time to arrive.

## 2019-03-31 ENCOUNTER — Encounter
Admission: RE | Admit: 2019-03-31 | Discharge: 2019-03-31 | Disposition: A | Discharge status: Home / Self Care | Payer: BC Managed Care – PPO | Source: Ambulatory Visit | Attending: Surgery | Admitting: Surgery

## 2019-03-31 ENCOUNTER — Other Ambulatory Visit: Payer: Self-pay

## 2019-03-31 DIAGNOSIS — Z01812 Encounter for preprocedural laboratory examination: Secondary | ICD-10-CM | POA: Diagnosis not present

## 2019-03-31 HISTORY — DX: Cardiac arrhythmia, unspecified: I49.9

## 2019-03-31 HISTORY — DX: Unspecified asthma, uncomplicated: J45.909

## 2019-03-31 HISTORY — DX: Essential (primary) hypertension: I10

## 2019-03-31 HISTORY — DX: Paresthesia of skin: R20.2

## 2019-03-31 HISTORY — DX: Other seasonal allergic rhinitis: J30.2

## 2019-03-31 NOTE — Patient Instructions (Signed)
Your procedure is scheduled on: Friday 2/26 Report to Day Surgery. To find out your arrival time please call [phone] between 1PM - 3PM on Thurs 2/25.  Remember: Instructions that are not followed completely may result in serious medical risk,  up to and including death, or upon the discretion of your surgeon and anesthesiologist your  surgery may need to be rescheduled.     _X__ 1. Do not eat food after midnight the night before your procedure.                 No gum chewing or hard candies. You may drink clear liquids up to 2 hours                 before you are scheduled to arrive for your surgery- DO not drink clear                 liquids within 2 hours of the start of your surgery.                 Clear Liquids include:  water, apple juice without pulp, clear Gatorade, G2 or                  Gatorade Zero (avoid Red/Purple/Blue), Black Coffee or Tea (Do not add                 anything to coffee or tea). _____2.   Complete the carbohydrate drink provided to you, 2 hours before arrival.  __X__2.  On the morning of surgery brush your teeth with toothpaste and water, you                may rinse your mouth with mouthwash if you wish.  Do not swallow any toothpaste of mouthwash.     __ 3.  No Alcohol for 24 hours before or after surgery.   ___ 4.  Do Not Smoke or use e-cigarettes For 24 Hours Prior to Your Surgery.                 Do not use any chewable tobacco products for at least 6 hours prior to                 surgery.  ____  5.  Bring all medications with you on the day of surgery if instructed.   _x___  6.  Notify your doctor if there is any change in your medical condition      (cold, fever, infections).     Do not wear jewelry, make-up, hairpins, clips or nail polish. Do not wear lotions, powders, or perfumes. You may wear deodorant. Do not shave 48 hours prior to surgery. Men may shave face and neck. Do not bring valuables to the hospital.     [facility] is not responsible for any belongings or valuables.  Contacts, dentures or bridgework may not be worn into surgery. Leave your suitcase in the car. After surgery it may be brought to your room. For patients admitted to the hospital, discharge time is determined by your treatment team.   Patients discharged the day of surgery will not be allowed to drive home.   Make arrangements for someone to be with you for the first 24 hours of your Same Day Discharge.    Please read over the following fact sheets that you were given:       __x__ Take these medicines the morning of surgery with A SIP OF WATER:  1. fluticasone (FLONASE) 50 MCG/ACT nasal spray  2. loratadine (CLARITIN REDITABS) 10 MG dissolvable tablet  3. hydroxypropyl methylcellulose / hypromellose (ISOPTO TEARS / GONIOVISC) 2.5 % ophthalmic solution if needed  4.  5.  6.  ____ Fleet Enema (as directed)   _x___ Use CHG Soap (or wipes) as directed  ____ Use Benzoyl Peroxide Gel as instructed  __x__ Use inhalers on the day of surgery albuterol (VENTOLIN HFA) 108 (90 Base) MCG/ACT inhaler and bring with you.  ____ Stop metformin 2 days prior to surgery    ____ Take 1/2 of usual insulin dose the night before surgery. No insulin the morning          of surgery.   ____ Stop Coumadin/Plavix/aspirin on   __x__ Stop Anti-inflammatories ibuprofen (ADVIL) 200 MG tablet aleve or aspirin.  May use pain gel   ____ Stop supplements until after surgery.    ____ Bring C-Pap to the hospital.    Senokot S laxative stool softener combination.  One twice daily while on narcotic pain medication

## 2019-04-01 ENCOUNTER — Encounter
Admission: RE | Admit: 2019-04-01 | Discharge: 2019-04-01 | Disposition: A | Discharge status: Home / Self Care | Payer: BC Managed Care – PPO | Source: Ambulatory Visit | Attending: Surgery | Admitting: Surgery

## 2019-04-01 ENCOUNTER — Other Ambulatory Visit: Payer: BC Managed Care – PPO

## 2019-04-01 DIAGNOSIS — Z01812 Encounter for preprocedural laboratory examination: Secondary | ICD-10-CM | POA: Insufficient documentation

## 2019-04-01 DIAGNOSIS — Z01810 Encounter for preprocedural cardiovascular examination: Secondary | ICD-10-CM | POA: Insufficient documentation

## 2019-04-01 DIAGNOSIS — Z20822 Contact with and (suspected) exposure to covid-19: Secondary | ICD-10-CM | POA: Diagnosis not present

## 2019-04-01 DIAGNOSIS — I10 Essential (primary) hypertension: Secondary | ICD-10-CM | POA: Diagnosis not present

## 2019-04-01 LAB — SARS CORONAVIRUS 2 (TAT 6-24 HRS): SARS Coronavirus 2: NEGATIVE

## 2019-04-03 ENCOUNTER — Encounter
Admission: RE | Disposition: A | Discharge status: Home / Self Care | Payer: Self-pay | Source: Home / Self Care | Attending: Surgery

## 2019-04-03 ENCOUNTER — Other Ambulatory Visit: Payer: Self-pay

## 2019-04-03 ENCOUNTER — Ambulatory Visit
Admission: RE | Admit: 2019-04-03 | Discharge: 2019-04-03 | Disposition: A | Discharge status: Home / Self Care | Payer: BC Managed Care – PPO | Attending: Surgery | Admitting: Surgery

## 2019-04-03 ENCOUNTER — Ambulatory Visit: Payer: BC Managed Care – PPO | Admitting: Certified Registered Nurse Anesthetist

## 2019-04-03 ENCOUNTER — Encounter: Payer: Self-pay | Admitting: Surgery

## 2019-04-03 DIAGNOSIS — E892 Postprocedural hypoparathyroidism: Secondary | ICD-10-CM | POA: Diagnosis not present

## 2019-04-03 DIAGNOSIS — J45909 Unspecified asthma, uncomplicated: Secondary | ICD-10-CM | POA: Insufficient documentation

## 2019-04-03 DIAGNOSIS — K8064 Calculus of gallbladder and bile duct with chronic cholecystitis without obstruction: Secondary | ICD-10-CM | POA: Insufficient documentation

## 2019-04-03 DIAGNOSIS — K8020 Calculus of gallbladder without cholecystitis without obstruction: Secondary | ICD-10-CM

## 2019-04-03 DIAGNOSIS — I10 Essential (primary) hypertension: Secondary | ICD-10-CM | POA: Insufficient documentation

## 2019-04-03 DIAGNOSIS — Z79899 Other long term (current) drug therapy: Secondary | ICD-10-CM | POA: Insufficient documentation

## 2019-04-03 HISTORY — PX: CHOLECYSTECTOMY: SHX55

## 2019-04-03 SURGERY — LAPAROSCOPIC CHOLECYSTECTOMY
Anesthesia: General

## 2019-04-03 MED ORDER — GLYCOPYRROLATE 0.2 MG/ML IJ SOLN
INTRAMUSCULAR | Status: AC
  Filled 2019-04-03: qty 1

## 2019-04-03 MED ORDER — MIDAZOLAM HCL 2 MG/2ML IJ SOLN
INTRAMUSCULAR | Status: AC
  Filled 2019-04-03: qty 2

## 2019-04-03 MED ORDER — KETOROLAC TROMETHAMINE 30 MG/ML IJ SOLN
INTRAMUSCULAR | Status: AC
  Filled 2019-04-03: qty 1

## 2019-04-03 MED ORDER — KETOROLAC TROMETHAMINE 30 MG/ML IJ SOLN
INTRAMUSCULAR | Status: DC | PRN
  Administered 2019-04-03: 15 mg via INTRAVENOUS

## 2019-04-03 MED ORDER — CHLORHEXIDINE GLUCONATE CLOTH 2 % EX PADS
6.0000 | MEDICATED_PAD | Freq: Once | CUTANEOUS | Status: AC
  Administered 2019-04-03: 6 via TOPICAL

## 2019-04-03 MED ORDER — FENTANYL CITRATE (PF) 100 MCG/2ML IJ SOLN
25.0000 ug | INTRAMUSCULAR | Status: DC | PRN
  Administered 2019-04-03: 12:00:00 25 ug via INTRAVENOUS

## 2019-04-03 MED ORDER — FENTANYL CITRATE (PF) 100 MCG/2ML IJ SOLN
INTRAMUSCULAR | Status: AC
  Filled 2019-04-03: qty 2

## 2019-04-03 MED ORDER — HYDROCODONE-ACETAMINOPHEN 5-325 MG PO TABS: 1.0000 | ORAL_TABLET | Freq: Four times a day (QID) | ORAL | 0 refills | Status: DC | PRN

## 2019-04-03 MED ORDER — FENTANYL CITRATE (PF) 100 MCG/2ML IJ SOLN
INTRAMUSCULAR | Status: DC | PRN
  Administered 2019-04-03 (×4): 50 ug via INTRAVENOUS

## 2019-04-03 MED ORDER — ONDANSETRON HCL 4 MG/2ML IJ SOLN
INTRAMUSCULAR | Status: DC | PRN
  Administered 2019-04-03: 4 mg via INTRAVENOUS

## 2019-04-03 MED ORDER — ONDANSETRON HCL 4 MG/2ML IJ SOLN
INTRAMUSCULAR | Status: AC
  Filled 2019-04-03: qty 2

## 2019-04-03 MED ORDER — ACETAMINOPHEN 500 MG PO TABS
1000.0000 mg | ORAL_TABLET | ORAL | Status: AC
  Administered 2019-04-03: 09:00:00 1000 mg via ORAL

## 2019-04-03 MED ORDER — ROCURONIUM BROMIDE 50 MG/5ML IV SOLN
INTRAVENOUS | Status: AC
  Filled 2019-04-03: qty 1

## 2019-04-03 MED ORDER — BUPIVACAINE-EPINEPHRINE 0.25% -1:200000 IJ SOLN
INTRAMUSCULAR | Status: DC | PRN
  Administered 2019-04-03: 10 mL
  Administered 2019-04-03: 20 mL

## 2019-04-03 MED ORDER — LACTATED RINGERS IV SOLN: INTRAVENOUS | Status: DC

## 2019-04-03 MED ORDER — ROCURONIUM BROMIDE 100 MG/10ML IV SOLN
INTRAVENOUS | Status: DC | PRN
  Administered 2019-04-03: 5 mg via INTRAVENOUS
  Administered 2019-04-03: 45 mg via INTRAVENOUS

## 2019-04-03 MED ORDER — CHLORHEXIDINE GLUCONATE CLOTH 2 % EX PADS
6.0000 | MEDICATED_PAD | Freq: Once | CUTANEOUS | Status: AC
  Administered 2019-04-03: 06:00:00 6 via TOPICAL

## 2019-04-03 MED ORDER — CEFAZOLIN SODIUM-DEXTROSE 2-4 GM/100ML-% IV SOLN
2.0000 g | INTRAVENOUS | Status: AC
  Administered 2019-04-03: 2 g via INTRAVENOUS

## 2019-04-03 MED ORDER — PROPOFOL 10 MG/ML IV BOLUS
INTRAVENOUS | Status: AC
  Filled 2019-04-03: qty 40

## 2019-04-03 MED ORDER — MIDAZOLAM HCL 2 MG/2ML IJ SOLN
INTRAMUSCULAR | Status: DC | PRN
  Administered 2019-04-03: 2 mg via INTRAVENOUS

## 2019-04-03 MED ORDER — LIDOCAINE HCL (PF) 2 % IJ SOLN
INTRAMUSCULAR | Status: AC
  Filled 2019-04-03: qty 5

## 2019-04-03 MED ORDER — SUCCINYLCHOLINE CHLORIDE 20 MG/ML IJ SOLN
INTRAMUSCULAR | Status: AC
  Filled 2019-04-03: qty 1

## 2019-04-03 MED ORDER — OXYCODONE HCL 5 MG PO TABS
ORAL_TABLET | ORAL | Status: AC
  Administered 2019-04-03: 12:00:00 5 mg via ORAL
  Filled 2019-04-03: qty 1

## 2019-04-03 MED ORDER — PROPOFOL 10 MG/ML IV BOLUS
INTRAVENOUS | Status: DC | PRN
  Administered 2019-04-03: 150 mg via INTRAVENOUS

## 2019-04-03 MED ORDER — CEFAZOLIN SODIUM-DEXTROSE 2-4 GM/100ML-% IV SOLN
INTRAVENOUS | Status: AC
  Filled 2019-04-03: qty 100

## 2019-04-03 MED ORDER — DEXAMETHASONE SODIUM PHOSPHATE 10 MG/ML IJ SOLN
INTRAMUSCULAR | Status: AC
  Filled 2019-04-03: qty 1

## 2019-04-03 MED ORDER — SUCCINYLCHOLINE CHLORIDE 20 MG/ML IJ SOLN
INTRAMUSCULAR | Status: DC | PRN
  Administered 2019-04-03: 100 mg via INTRAVENOUS

## 2019-04-03 MED ORDER — INDOCYANINE GREEN 25 MG IV SOLR
7.5000 mg | Freq: Once | INTRAVENOUS | Status: DC
  Filled 2019-04-03: qty 10

## 2019-04-03 MED ORDER — GABAPENTIN 300 MG PO CAPS
300.0000 mg | ORAL_CAPSULE | ORAL | Status: AC
  Administered 2019-04-03: 300 mg via ORAL

## 2019-04-03 MED ORDER — OXYCODONE HCL 5 MG PO TABS: 5.0000 mg | ORAL_TABLET | Freq: Once | ORAL | Status: AC | PRN

## 2019-04-03 MED ORDER — SUGAMMADEX SODIUM 200 MG/2ML IV SOLN
INTRAVENOUS | Status: DC | PRN
  Administered 2019-04-03: 160.6 mg via INTRAVENOUS

## 2019-04-03 MED ORDER — ACETAMINOPHEN 500 MG PO TABS
ORAL_TABLET | ORAL | Status: AC
  Filled 2019-04-03: qty 2

## 2019-04-03 MED ORDER — LIDOCAINE HCL (CARDIAC) PF 100 MG/5ML IV SOSY
PREFILLED_SYRINGE | INTRAVENOUS | Status: DC | PRN
  Administered 2019-04-03: 100 mg via INTRAVENOUS

## 2019-04-03 MED ORDER — DEXAMETHASONE SODIUM PHOSPHATE 10 MG/ML IJ SOLN
INTRAMUSCULAR | Status: DC | PRN
  Administered 2019-04-03: 6 mg via INTRAVENOUS

## 2019-04-03 MED ORDER — PHENYLEPHRINE HCL (PRESSORS) 10 MG/ML IV SOLN
INTRAVENOUS | Status: DC | PRN
  Administered 2019-04-03: 100 ug via INTRAVENOUS

## 2019-04-03 MED ORDER — SUGAMMADEX SODIUM 200 MG/2ML IV SOLN
INTRAVENOUS | Status: AC
  Filled 2019-04-03: qty 2

## 2019-04-03 MED ORDER — PHENYLEPHRINE HCL (PRESSORS) 10 MG/ML IV SOLN
INTRAVENOUS | Status: AC
  Filled 2019-04-03: qty 1

## 2019-04-03 MED ORDER — OXYCODONE HCL 5 MG/5ML PO SOLN: 5.0000 mg | Freq: Once | ORAL | Status: AC | PRN

## 2019-04-03 MED ORDER — GABAPENTIN 300 MG PO CAPS
ORAL_CAPSULE | ORAL | Status: AC
  Filled 2019-04-03: qty 1

## 2019-04-03 SURGICAL SUPPLY — 42 items
APPLICATOR COTTON TIP 6 STRL (MISCELLANEOUS) IMPLANT
APPLICATOR COTTON TIP 6IN STRL (MISCELLANEOUS)
APPLIER CLIP 5 13 M/L LIGAMAX5 (MISCELLANEOUS) ×2
CANISTER SUCT 1200ML W/VALVE (MISCELLANEOUS) ×2 IMPLANT
CHLORAPREP W/TINT 26 (MISCELLANEOUS) ×2 IMPLANT
CHOLANGIOGRAM CATH TAUT (CATHETERS) IMPLANT
CLIP APPLIE 5 13 M/L LIGAMAX5 (MISCELLANEOUS) ×1 IMPLANT
COVER WAND RF STERILE (DRAPES) ×2 IMPLANT
DECANTER SPIKE VIAL GLASS SM (MISCELLANEOUS) IMPLANT
DERMABOND ADVANCED (GAUZE/BANDAGES/DRESSINGS) ×1
DERMABOND ADVANCED .7 DNX12 (GAUZE/BANDAGES/DRESSINGS) ×1 IMPLANT
DRAPE C-ARM XRAY 36X54 (DRAPES) ×2 IMPLANT
ELECT CAUTERY BLADE 6.4 (BLADE) ×2 IMPLANT
ELECT REM PT RETURN 9FT ADLT (ELECTROSURGICAL) ×2
ELECTRODE REM PT RTRN 9FT ADLT (ELECTROSURGICAL) ×1 IMPLANT
GLOVE BIO SURGEON STRL SZ7 (GLOVE) ×2 IMPLANT
GOWN STRL REUS W/ TWL LRG LVL3 (GOWN DISPOSABLE) ×3 IMPLANT
GOWN STRL REUS W/TWL LRG LVL3 (GOWN DISPOSABLE) ×3
IRRIGATION STRYKERFLOW (MISCELLANEOUS) ×1 IMPLANT
IRRIGATOR STRYKERFLOW (MISCELLANEOUS) ×2
IV CATH ANGIO 12GX3 LT BLUE (NEEDLE) IMPLANT
IV NS 1000ML (IV SOLUTION) ×1
IV NS 1000ML BAXH (IV SOLUTION) ×1 IMPLANT
L-HOOK LAP DISP 36CM (ELECTROSURGICAL) ×2
LHOOK LAP DISP 36CM (ELECTROSURGICAL) ×1 IMPLANT
NEEDLE HYPO 22GX1.5 SAFETY (NEEDLE) ×2 IMPLANT
NS IRRIG 500ML POUR BTL (IV SOLUTION) ×2 IMPLANT
PACK LAP CHOLECYSTECTOMY (MISCELLANEOUS) ×2 IMPLANT
PENCIL ELECTRO HAND CTR (MISCELLANEOUS) ×2 IMPLANT
POUCH SPECIMEN RETRIEVAL 10MM (ENDOMECHANICALS) ×2 IMPLANT
SCISSORS METZENBAUM CVD 33 (INSTRUMENTS) ×2 IMPLANT
SET TUBE SMOKE EVAC HIGH FLOW (TUBING) ×2 IMPLANT
SLEEVE ENDOPATH XCEL 5M (ENDOMECHANICALS) ×4 IMPLANT
SPONGE LAP 18X18 RF (DISPOSABLE) ×2 IMPLANT
SPONGE LAP 4X18 RFD (DISPOSABLE) ×2 IMPLANT
STOPCOCK 4 WAY LG BORE MALE ST (IV SETS) IMPLANT
SUT ETHIBOND 0 MO6 C/R (SUTURE) IMPLANT
SUT MNCRL AB 4-0 PS2 18 (SUTURE) ×4 IMPLANT
SUT VICRYL 0 AB UR-6 (SUTURE) ×4 IMPLANT
SYR 20ML LL LF (SYRINGE) ×2 IMPLANT
TROCAR XCEL BLUNT TIP 100MML (ENDOMECHANICALS) ×2 IMPLANT
TROCAR XCEL NON-BLD 5MMX100MML (ENDOMECHANICALS) ×2 IMPLANT

## 2019-04-03 NOTE — Interval H&P Note (Signed)
History and Physical Interval Note:  04/03/2019 9:19 AM  [name]  has presented today for surgery, with the diagnosis of Cholelithiasis.  The various methods of treatment have been discussed with the patient and family. After consideration of risks, benefits and other options for treatment, the patient has consented to  Procedure(s): LAPAROSCOPIC CHOLECYSTECTOMY (N/A) as a surgical intervention.  The patient's history has been reviewed, patient examined, no change in status, stable for surgery.  I have reviewed the patient's chart and labs.  Questions were answered to the patient's satisfaction.     Barbara Harvey F Jamal Pavon

## 2019-04-03 NOTE — Anesthesia Preprocedure Evaluation (Signed)
Anesthesia Evaluation  Patient identified by MRN, date of birth, ID band Patient awake    Reviewed: Allergy & Precautions, H&P , NPO status , Patient's Chart, lab work & pertinent test results  History of Anesthesia Complications (+) Family history of anesthesia reaction and history of anesthetic complications  Airway Mallampati: III  TM Distance: <3 FB Neck ROM: limited    Dental  (+) Chipped   Pulmonary asthma ,           Cardiovascular Exercise Tolerance: Good hypertension, (-) angina(-) Past MI and (-) DOE + dysrhythmias + Valvular Problems/Murmurs      Neuro/Psych negative neurological ROS  negative psych ROS   GI/Hepatic Neg liver ROS, GERD  Medicated and Controlled,  Endo/Other  negative endocrine ROS  Renal/GU      Musculoskeletal  (+) Arthritis ,   Abdominal   Peds  Hematology negative hematology ROS (+)   Anesthesia Other Findings Past Medical History: No date: Arthritis No date: Asthma     Comment:  seasonal allergies only No date: Dysrhythmia     Comment:  rapid heart beat No date: Family history of adverse reaction to anesthesia     Comment:  dad-n/v  No date: GERD (gastroesophageal reflux disease)     Comment:  occ-gas x No date: Heart murmur     Comment:  asymptomatic No date: Hypertension No date: Paresthesia     Comment:  head and face No date: Seasonal allergies No date: Thyroid nodule  Past Surgical History: No date: COLONOSCOPY     Comment:  x3 10/03/2018: DILATATION & CURETTAGE/HYSTEROSCOPY WITH MYOSURE; N/A     Comment:  Procedure: FRACTIONAL DILATATION &               CURETTAGE/HYSTEROSCOPY WITH MYOSURE RESECTION OF POLYP;                Surgeon: Schermerhorn, Ihor Austin, MD;  Location: [facility] ORS;              Service: Gynecology;  Laterality: N/A; No date: MOUTH SURGERY No date: PARATHYROIDECTOMY No date: TONSILLECTOMY     Comment:  age 64      Reproductive/Obstetrics negative OB ROS                             Anesthesia Physical Anesthesia Plan  ASA: III  Anesthesia Plan: General ETT   Post-op Pain Management:    Induction: Intravenous  PONV Risk Score and Plan: Ondansetron, Dexamethasone, Midazolam and Treatment may vary due to age or medical condition  Airway Management Planned: Oral ETT  Additional Equipment:   Intra-op Plan:   Post-operative Plan: Extubation in OR  Informed Consent: I have reviewed the patients History and Physical, chart, labs and discussed the procedure including the risks, benefits and alternatives for the proposed anesthesia with the patient or authorized representative who has indicated his/her understanding and acceptance.     Dental Advisory Given  Plan Discussed with: Anesthesiologist, CRNA and Surgeon  Anesthesia Plan Comments: (Patient consented for risks of anesthesia including but not limited to:  - adverse reactions to medications - damage to teeth, lips or other oral mucosa - sore throat or hoarseness - Damage to heart, brain, lungs or loss of life  Patient voiced understanding.)        Anesthesia Quick Evaluation

## 2019-04-03 NOTE — Discharge Instructions (Addendum)
Laparoscopic Cholecystectomy, Care After  ° °These instructions give you information on caring for yourself after your procedure. Your doctor may also give you more specific instructions. Call your doctor if you have any problems or questions after your procedure.  °HOME CARE  °Change your bandages (dressings) as told by your doctor.  °Keep the wound dry and clean. Wash the wound gently with soap and water. Pat the wound dry with a clean towel.  °Do not take baths, swim, or use hot tubs for 2 weeks, or as told by your doctor.  °Only take medicine as told by your doctor.  °Eat a normal diet as told by your doctor.  °Do not lift anything heavier than 10 pounds (4.5 kg) until your doctor says it is okay.  °Do not play contact sports for 1 week, or as told by your doctor. °GET HELP IF:  °Your wound is red, puffy (swollen), or painful.  °You have yellowish-white fluid (pus) coming from the wound.  °You have fluid draining from the wound for more than 1 day.  °You have a bad smell coming from the wound.  °Your wound breaks open. °GET HELP RIGHT AWAY IF:  °You have trouble breathing.  °You have chest pain.  °You have a fever >101  °You have pain in the shoulders (shoulder strap areas) that is getting worse.  °You feel dizzy or pass out (faint).  °You have severe belly (abdominal) pain.  °You feel sick to your stomach (nauseous) or throw up (vomit) for more than 1 day. ° ° ° °AMBULATORY SURGERY  °DISCHARGE INSTRUCTIONS ° ° °1) The drugs that you were given will stay in your system until tomorrow so for the next 24 hours you should not: ° °A) Drive an automobile °B) Make any legal decisions °C) Drink any alcoholic beverage ° ° °2) You may resume regular meals tomorrow.  Today it is better to start with liquids and gradually work up to solid foods. ° °You may eat anything you prefer, but it is better to start with liquids, then soup and crackers, and gradually work up to solid foods. ° ° °3) Please notify your doctor  immediately if you have any unusual bleeding, trouble breathing, redness and pain at the surgery site, drainage, fever, or pain not relieved by medication. ° ° ° °4) Additional Instructions: ° ° ° ° ° ° ° °Please contact your physician with any problems or Same Day Surgery at [phone], Monday through Friday 6 am to 4 pm, or [facility] number at [phone]. ° ° °

## 2019-04-03 NOTE — Anesthesia Procedure Notes (Signed)
Procedure Name: Intubation Performed by: Malva Cogan, CRNA Pre-anesthesia Checklist: Patient identified, Patient being monitored, Timeout performed, Emergency Drugs available and Suction available Patient Re-evaluated:Patient Re-evaluated prior to induction Oxygen Delivery Method: Circle system utilized Preoxygenation: Pre-oxygenation with 100% oxygen Induction Type: IV induction Ventilation: Mask ventilation without difficulty Laryngoscope Size: 3 and McGraph Grade View: Grade I Tube type: Oral Tube size: 7.0 mm Number of attempts: 1 Airway Equipment and Method: Stylet Placement Confirmation: ETT inserted through vocal cords under direct vision,  positive ETCO2 and breath sounds checked- equal and bilateral Secured at: 21 cm Tube secured with: Tape Dental Injury: Teeth and Oropharynx as per pre-operative assessment  Difficulty Due To: Difficult Airway- due to anterior larynx and Difficulty was anticipated Future Recommendations: Recommend- induction with short-acting agent, and alternative techniques readily available

## 2019-04-03 NOTE — OR Nursing (Addendum)
This RN spoke with Dr. Everlene Farrier at this time about pt robotic procedure changing to laparoscopic. IC green has been discontinued. Consent will be updated upon Dr. Everlene Farrier arrival to bedside

## 2019-04-03 NOTE — Transfer of Care (Signed)
Immediate Anesthesia Transfer of Care Note  Patient: Barbara Harvey  Procedure(s) Performed: LAPAROSCOPIC CHOLECYSTECTOMY (N/A )  Patient Location: PACU  Anesthesia Type: Spinal  Level of Consciousness: drowsy  Airway & Oxygen Therapy: Patient Spontanous Breathing and Patient connected to face mask oxygen  Post-op Assessment: Report given to RN and Post -op Vital signs reviewed and stable  Post vital signs: Reviewed and stable  Last Vitals:  Vitals Value Taken Time  BP 149/87 04/03/19 1107  Temp 36.2 C 04/03/19 1107  Pulse 73 04/03/19 1108  Resp 13 04/03/19 1108  SpO2 100 % 04/03/19 1108  Vitals shown include unvalidated device data.  Last Pain:  Vitals:   04/03/19 0804  TempSrc: Oral  PainSc: 0-No pain         Complications: No apparent anesthesia complications

## 2019-04-03 NOTE — Op Note (Signed)
Laparoscopic Cholecystectomy  Pre-operative Diagnosis: Biliary colic  Post-operative Diagnosis: same  Procedure: laparoscopic cholecystectomy  Surgeon: Sterling Big, MD FACS  Anesthesia: Gen. with endotracheal tube   Findings: Mild chonic Cholecystitis   Estimated Blood Loss: 10cc         Drains: none         Specimens: Gallbladder           Complications: none   Procedure Details  The patient was seen again in the Holding Room. The benefits, complications, treatment options, and expected outcomes were discussed with the patient. The risks of bleeding, infection, recurrence of symptoms, failure to resolve symptoms, bile duct damage, bile duct leak, retained common bile duct stone, bowel injury, any of which could require further surgery and/or ERCP, stent, or papillotomy were reviewed with the patient. The likelihood of improving the patient's symptoms with return to their baseline status is good.  The patient and/or family concurred with the proposed plan, giving informed consent.  The patient was taken to Operating Room, identified as Barbara Harvey and the procedure verified as Laparoscopic Cholecystectomy.  A Time Out was held and the above information confirmed.  Prior to the induction of general anesthesia, antibiotic prophylaxis was administered. VTE prophylaxis was in place. General endotracheal anesthesia was then administered and tolerated well. After the induction, the abdomen was prepped with Chloraprep and draped in the sterile fashion. The patient was positioned in the supine position.  Cut down technique was used to enter the abdominal cavity and a Hasson trochar was placed after two vicryl stitches were anchored to the fascia. Pneumoperitoneum was then created with CO2 and tolerated well without any adverse changes in the patient's vital signs.  Three 5-mm ports were placed in the right upper quadrant all under direct vision. All skin incisions  were infiltrated with a  local anesthetic agent before making the incision and placing the trocars.   The patient was positioned  in reverse Trendelenburg, tilted slightly to the patient's left.  The gallbladder was identified, the fundus grasped and retracted cephalad. Adhesions were lysed bluntly. The infundibulum was grasped and retracted laterally, exposing the peritoneum overlying the triangle of Calot. This was then divided and exposed in a blunt fashion. An extended critical view of the cystic duct and cystic artery was obtained.  The cystic duct was clearly identified and bluntly dissected.   Artery and duct were double clipped and divided.  The gallbladder was taken from the gallbladder fossa in a retrograde fashion with the electrocautery. The gallbladder was removed and placed in an Endocatch bag. The liver bed was irrigated and inspected. Hemostasis was achieved with the electrocautery. Copious irrigation was utilized and was repeatedly aspirated until clear.  The gallbladder and Endocatch sac were then removed through a port site.     Inspection of the right upper quadrant was performed. No bleeding, bile duct injury or leak, or bowel injury was noted. Pneumoperitoneum was released.  The periumbilical port site was closed with interrumpted 0 Vicryl sutures. 4-0 subcuticular Monocryl was used to close the skin. Dermabond was  applied.  The patient was then extubated and brought to the recovery room in stable condition. Sponge, lap, and needle counts were correct at closure and at the conclusion of the case.               Sterling Big, MD, FACS

## 2019-04-06 LAB — SURGICAL PATHOLOGY

## 2019-04-08 NOTE — Anesthesia Postprocedure Evaluation (Signed)
Anesthesia Post Note  Patient: [name]  Procedure(s) Performed: LAPAROSCOPIC CHOLECYSTECTOMY (N/A )  Patient location during evaluation: PACU Anesthesia Type: General Level of consciousness: awake and alert Pain management: pain level controlled Vital Signs Assessment: post-procedure vital signs reviewed and stable Respiratory status: spontaneous breathing, nonlabored ventilation, respiratory function stable and patient connected to nasal cannula oxygen Cardiovascular status: blood pressure returned to baseline and stable Postop Assessment: no apparent nausea or vomiting Anesthetic complications: no     Last Vitals:  Vitals:   04/03/19 1255 04/03/19 1321  BP: (!) 151/87 (!) 148/77  Pulse: 70 74  Resp: 16 16  Temp: 36.6 C   SpO2: 99% 96%    Last Pain:  Vitals:   04/06/19 0833  TempSrc:   PainSc: 4                  Yevette Edwards

## 2019-04-13 ENCOUNTER — Telehealth: Payer: Self-pay | Admitting: Surgery

## 2019-04-13 MED ORDER — GABAPENTIN 300 MG PO CAPS: 300.0000 mg | ORAL_CAPSULE | Freq: Three times a day (TID) | ORAL | 0 refills | Status: DC

## 2019-04-13 NOTE — Telephone Encounter (Signed)
Patient is calling and has some questions and would like one of the nurses to give her a call back. Please call patient and advise.

## 2019-04-13 NOTE — Telephone Encounter (Signed)
Spoke with patient. Patient states she is experiencing discomfort at her incision at her navel from her lap choley surgery on 04/03/19. I advised patient that is normal to experience that discomfort in the area and that she may try alternating Tylenol and Ibuprofen to help with the discomfort. Patient may also try cold compress to the area. Per Dr.Pabon we will send over prescription for Gabapentin 300 mg TID # 30. Patient was advised to let Dr.Pabon know if she is still having any discomfort Wednesday at her virtual appt. Patient verbalized understanding of information provided.

## 2019-04-15 ENCOUNTER — Telehealth (INDEPENDENT_AMBULATORY_CARE_PROVIDER_SITE_OTHER): Payer: BC Managed Care – PPO | Admitting: Surgery

## 2019-04-15 ENCOUNTER — Other Ambulatory Visit: Payer: Self-pay

## 2019-04-15 DIAGNOSIS — Z09 Encounter for follow-up examination after completed treatment for conditions other than malignant neoplasm: Secondary | ICD-10-CM

## 2019-04-17 NOTE — Progress Notes (Signed)
I talked to the pt on her cellphone S/p lap chole Doing well Some knot around one of the incisions  Taking PO No fevers or chills We will have her RTC for face to face appt next week to check her wound

## 2019-04-22 ENCOUNTER — Encounter: Payer: Self-pay | Admitting: Surgery

## 2019-04-22 ENCOUNTER — Ambulatory Visit (INDEPENDENT_AMBULATORY_CARE_PROVIDER_SITE_OTHER): Payer: Self-pay | Admitting: Surgery

## 2019-04-22 ENCOUNTER — Other Ambulatory Visit: Payer: Self-pay

## 2019-04-22 VITALS — BP 125/83 | HR 94 | Temp 97.1°F | Resp 12 | Ht 62.0 in | Wt 176.6 lb

## 2019-04-22 DIAGNOSIS — Z09 Encounter for follow-up examination after completed treatment for conditions other than malignant neoplasm: Secondary | ICD-10-CM

## 2019-04-22 NOTE — Patient Instructions (Addendum)
Follow up as needed. Please call the office if you have any questions or concerns.   GENERAL POST-OPERATIVE PATIENT INSTRUCTIONS   WOUND CARE INSTRUCTIONS:  Keep a dry clean dressing on the wound if there is drainage. The initial bandage may be removed after 24 hours.  Once the wound has quit draining you may leave it open to air.  If clothing rubs against the wound or causes irritation and the wound is not draining you may cover it with a dry dressing during the daytime.  Try to keep the wound dry and avoid ointments on the wound unless directed to do so.  If the wound becomes bright red and painful or starts to drain infected material that is not clear, please contact your physician immediately.  If the wound is mildly pink and has a thick firm ridge underneath it, this is normal, and is referred to as a healing ridge.  This will resolve over the next 4-6 weeks.  BATHING: You may shower if you have been informed of this by your surgeon. However, Please do not submerge in a tub, hot tub, or pool until incisions are completely sealed or have been told by your surgeon that you may do so.  DIET:  You may eat any foods that you can tolerate.  It is a good idea to eat a high fiber diet and take in plenty of fluids to prevent constipation.  If you do become constipated you may want to take a mild laxative or take ducolax tablets on a daily basis until your bowel habits are regular.  Constipation can be very uncomfortable, along with straining, after recent surgery.  ACTIVITY:  You are encouraged to cough and deep breath or use your incentive spirometer if you were given one, every 15-30 minutes when awake.  This will help prevent respiratory complications and low grade fevers post-operatively if you had a general anesthetic.  You may want to hug a pillow when coughing and sneezing to add additional support to the surgical area, if you had abdominal or chest surgery, which will decrease pain during these  times.  You are encouraged to walk and engage in light activity for the next two weeks.  You should not lift more than 20 pounds, until 05/08/2019 as it could put you at increased risk for complications.  Twenty pounds is roughly equivalent to a plastic bag of groceries. At that time- Listen to your body when lifting, if you have pain when lifting, stop and then try again in a few days. Soreness after doing exercises or activities of daily living is normal as you get back in to your normal routine.  MEDICATIONS:  Try to take narcotic medications and anti-inflammatory medications, such as tylenol, ibuprofen, naprosyn, etc., with food.  This will minimize stomach upset from the medication.  Should you develop nausea and vomiting from the pain medication, or develop a rash, please discontinue the medication and contact your physician.  You should not drive, make important decisions, or operate machinery when taking narcotic pain medication.  SUNBLOCK Use sun block to incision area over the next year if this area will be exposed to sun. This helps decrease scarring and will allow you avoid a permanent darkened area over your incision.  QUESTIONS:  Please feel free to call our office if you have any questions, and we will be glad to assist you.

## 2019-04-22 NOTE — Progress Notes (Signed)
S/p lap chole Having some questions about wound Taking po, no fevers or chills   PE  NAD, alert Abd: soft, incisions c/d/i, no infection or hernia    A/p Doing well overall Reasured her about benign findings RTC prn

## 2019-04-24 ENCOUNTER — Ambulatory Visit: Payer: BC Managed Care – PPO | Attending: Internal Medicine

## 2019-04-24 DIAGNOSIS — Z23 Encounter for immunization: Secondary | ICD-10-CM

## 2019-04-24 NOTE — Progress Notes (Signed)
   Covid-19 Vaccination Clinic  Name:  OPEL LEJEUNE    MRN: [id number] DOB: 1955-11-20  04/24/2019  Ms. [name] was observed post Covid-19 immunization for 15 minutes without incident. She was provided with Vaccine Information Sheet and instruction to access the V-Safe system.   Ms. [name] was instructed to call 911 with any severe reactions post vaccine: Marland Kitchen Difficulty breathing  . Swelling of face and throat  . A fast heartbeat  . A bad rash all over body  . Dizziness and weakness   Immunizations Administered    Name Date Dose VIS Date Route   Pfizer COVID-19 Vaccine 04/24/2019 10:32 AM 0.3 mL 01/16/2019 Intramuscular   Manufacturer: [employer], [employer]   Lot: [id number]   NDC: [address]-4

## 2019-05-19 ENCOUNTER — Ambulatory Visit: Payer: BC Managed Care – PPO | Attending: Internal Medicine

## 2019-05-19 DIAGNOSIS — Z23 Encounter for immunization: Secondary | ICD-10-CM

## 2019-05-19 NOTE — Progress Notes (Signed)
   Covid-19 Vaccination Clinic  Name:  Barbara Harvey    MRN: [id number] DOB: 03/06/1955  05/19/2019  [name] was observed post Covid-19 immunization for 15 minutes without incident. She was provided with Vaccine Information Sheet and instruction to access the V-Safe system.   [name] was instructed to call 911 with any severe reactions post vaccine: Marland Kitchen Difficulty breathing  . Swelling of face and throat  . A fast heartbeat  . A bad rash all over body  . Dizziness and weakness   Immunizations Administered    Name Date Dose VIS Date Route   Pfizer COVID-19 Vaccine 05/19/2019 11:31 AM 0.3 mL 01/16/2019 Intramuscular   Manufacturer: [employer], [employer]   Lot: [id number]   NDC: [address]-9

## 2019-06-16 ENCOUNTER — Telehealth (INDEPENDENT_AMBULATORY_CARE_PROVIDER_SITE_OTHER): Payer: BC Managed Care – PPO | Admitting: Gastroenterology

## 2019-06-16 DIAGNOSIS — R7989 Other specified abnormal findings of blood chemistry: Secondary | ICD-10-CM

## 2019-06-16 DIAGNOSIS — R945 Abnormal results of liver function studies: Secondary | ICD-10-CM

## 2019-06-16 NOTE — Progress Notes (Signed)
[name] , MD [address]  [address]  [place], [place] [address]  Main: [phone]  Fax: [phone]   Primary Care Physician: Mariel Kansky, MD  Virtual Visit via Video Note  I connected with patient on 06/16/19 at  1:30 PM EDT by video and verified that I am speaking with the correct person using two identifiers.   I discussed the limitations, risks, security and privacy concerns of performing an evaluation and management service by video  and the availability of in person appointments. I also discussed with the patient that there may be a patient responsible charge related to this service. The patient expressed understanding and agreed to proceed.  Location of Patient: Home Location of Provider: Home Persons involved: Patient and provider only Attempt to perform video visit but had difficulty and issues connecting and converted to a telephone visit  History of Present Illness:  Follow-up for abnormal liver function tests  HPI: Barbara Harvey is a 64 y.o. female   Summary of history :  I was consulted to see her when she was admitted to the hospital on 02/21/2019.  At that time she was found to have elevated LFTs predominantly transaminases.Right upper quadrant ultrasound showed cholelithiasis without evidence of cholecystitis. Subsequently MRCP was performed. No acute findings. No choledocholithiasis or biliary tract obstruction.On admission hemoglobin was 15 g with a white cell count of 8.5 creatinine of 0.79. AST of 2400, ALT thousand 600 and total bilirubin of 3.1. Alkaline phosphatase of 179. Lipase was normal at 56. INR was normal at 0.9. Transaminases rapidly improved.  She did have a 2 day history of epigastric discomfort and bloating, on and off , non radiating , presently resolved, normal bowel movements. No alcohol, herbal supplements. Likely passed a gall stone.   03/02/2019: Liver function tests were rechecked and significantly improved with AST  of 25, ALT of 150 and alkaline phosphatase of 124.   Interval history 03/19/2019-06/16/2019  03/19/2019: LFT's, GGT- normal  04/03/2019: Gall bladder resected and path report shows chronic cholecystitis and cholelithiasis.  She states that since the surgery she has been doing fine no issues.  Denies any abdominal pain.    Current Outpatient Medications  Medication Sig Dispense Refill  . albuterol (VENTOLIN HFA) 108 (90 Base) MCG/ACT inhaler Inhale 1-2 puffs into the lungs every 4 (four) hours as needed for wheezing or shortness of breath.     Marland Kitchen amLODipine (NORVASC) 5 MG tablet Take 5 mg by mouth daily.    . Calcium Citrate-Vitamin D (CITRACAL + D PO) Take 1 tablet by mouth 2 (two) times daily.    Marland Kitchen docusate sodium (COLACE) 100 MG capsule Take 100 mg by mouth 2 (two) times daily.    . famotidine (PEPCID) 20 MG tablet Take 20 mg by mouth daily as needed for heartburn or indigestion.    . fluticasone (FLONASE) 50 MCG/ACT nasal spray Place 2 sprays into both nostrils daily as needed for allergies.     . hydroxypropyl methylcellulose / hypromellose (ISOPTO TEARS / GONIOVISC) 2.5 % ophthalmic solution Place 1 drop into both eyes 2 (two) times daily as needed for dry eyes.    Marland Kitchen ibuprofen (ADVIL) 200 MG tablet Take 400 mg by mouth every 6 (six) hours as needed for moderate pain.    Marland Kitchen loratadine (CLARITIN REDITABS) 10 MG dissolvable tablet Take 10 mg by mouth daily.    . montelukast (SINGULAIR) 10 MG tablet Take 10 mg by mouth at bedtime.  No current facility-administered medications for this visit.    Allergies as of 06/16/2019 - Review Complete 04/22/2019  Allergen Reaction Noted  . Pineapple Hives and Itching 09/15/2018    Review of Systems:    All systems reviewed and negative except where noted in HPI.  General Appearance:    Alert, cooperative, no distress, appears stated age  Head:    Normocephalic, without obvious abnormality, atraumatic  Eyes:    PERRL, conjunctiva/corneas  clear,  Ears:    Grossly normal hearing    Neurologic:  Grossly normal    Observations/Objective:  Labs: CMP     Component Value Date/Time   NA 141 02/23/2019 0435   K 3.5 02/23/2019 0435   CL 106 02/23/2019 0435   CO2 25 02/23/2019 0435   GLUCOSE 86 02/23/2019 0435   BUN 20 02/23/2019 0435   CREATININE 0.97 02/23/2019 0435   CALCIUM 8.6 (L) 02/23/2019 0435   PROT 6.8 03/19/2019 1504   ALBUMIN 4.4 03/19/2019 1504   AST 14 03/19/2019 1504   ALT 15 03/19/2019 1504   ALKPHOS 111 03/19/2019 1504   BILITOT 0.3 03/19/2019 1504   GFRNONAA >60 02/23/2019 0435   GFRAA >60 02/23/2019 0435   Lab Results  Component Value Date   WBC 5.5 02/23/2019   HGB 13.8 02/23/2019   HCT 43.7 02/23/2019   MCV 93.2 02/23/2019   PLT 279 02/23/2019    Imaging Studies: No results found.  Assessment and Plan:   Barbara Harvey is a 64 y.o. y/o female here today to follow up for a past history and hospitalization when she passed a gall stone and had transiently elevated LFT's . They have normalized and she is s/p cholecystectomy. H/o biliary colic prior to surgery .  All abdominal pain has resolved no acute issues presently Advised to return to the clinic in the future for any new issues.                   I discussed the assessment and treatment plan with the patient. The patient was provided an opportunity to ask questions and all were answered. The patient agreed with the plan and demonstrated an understanding of the instructions.   The patient was advised to call back or seek an in-person evaluation if the symptoms worsen or if the condition fails to improve as anticipated.  I provided 12 minutes of face-to-face time during this encounter.  Dr Jonathon Bellows MD,MRCP [facility]) Gastroenterology/Hepatology Pager: [phone]   Speech recognition software was used to dictate this note.

## 2021-11-10 IMAGING — MR MR MRCP
8 of 12 series · 33 of 48 positions shown · non-contrast
Comparison: No priors.

CLINICAL DATA: 63-year-old female with history of 2-3 days of
intermittent epigastric pain. Bloating sensation. Nausea.

EXAM:
MRI ABDOMEN WITHOUT CONTRAST  (INCLUDING MRCP)
TECHNIQUE: Multiplanar multisequence MR imaging of the abdomen was performed.
Heavily T2-weighted images of the biliary and pancreatic ducts were
obtained, and three-dimensional MRCP images were rendered by post
processing.

[Series 4: T2 · coronal · 6.0mm · 1.19mm/px · 2 of 29 slices shown (1 of 2)]
[im 1/29]
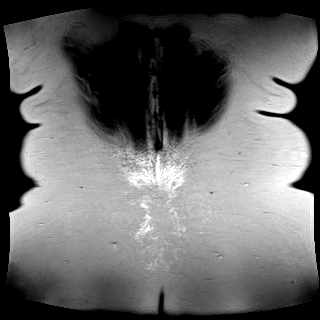
[im 29/29]
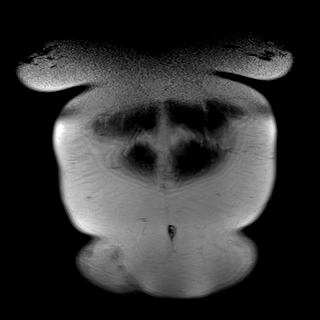

[Series 5: T2 · axial · 6.0mm · 1.19mm/px · z∈[+78,+302]mm · 3 of 32 slices shown (2 of 2)]
[im 1/32]
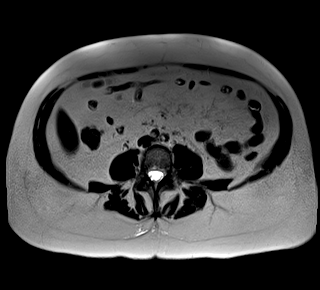
[im 16/32]
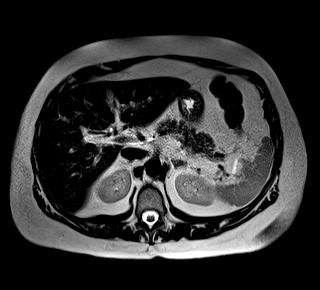
[im 32/32]
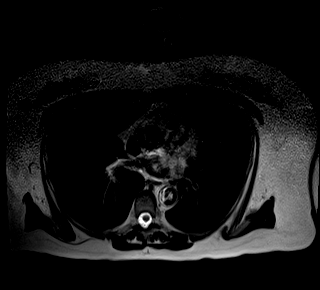

[Series 6: T1 · axial · 6.0mm · 0.74mm/px · z∈[+78,+302]mm · 3 of 32 slices shown (1 of 2)]
[im 1/32]
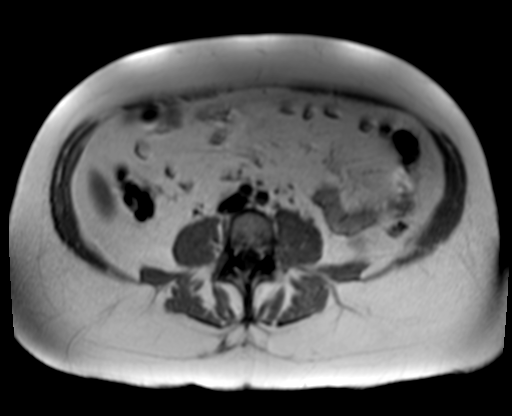
[im 16/32]
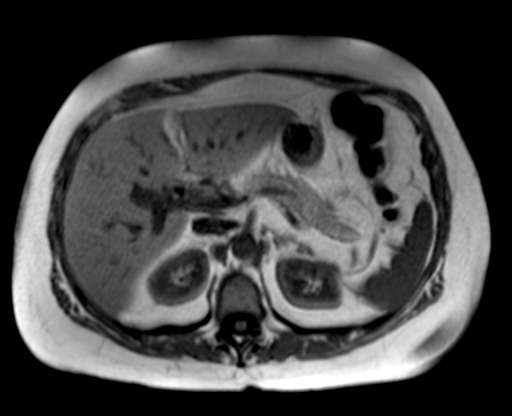
[im 32/32]
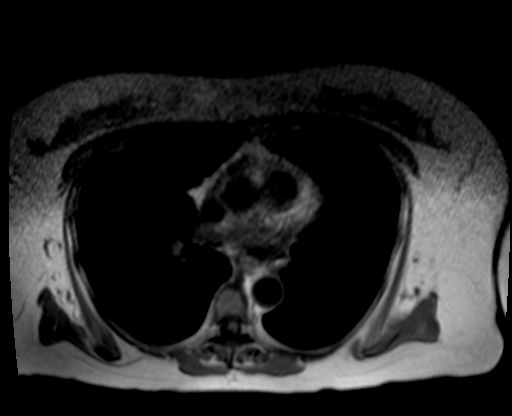

[Series 6: T1 · axial · 6.0mm · 0.74mm/px · z∈[+78,+302]mm · 3 of 32 slices shown (2 of 2)]
[im 1/32]
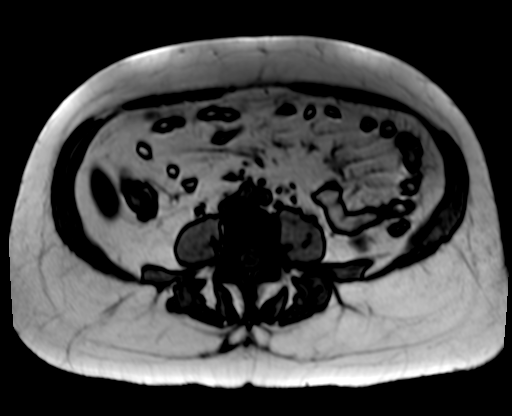
[im 16/32]
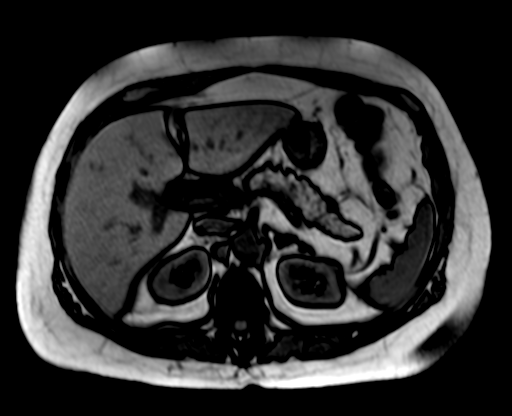
[im 32/32]
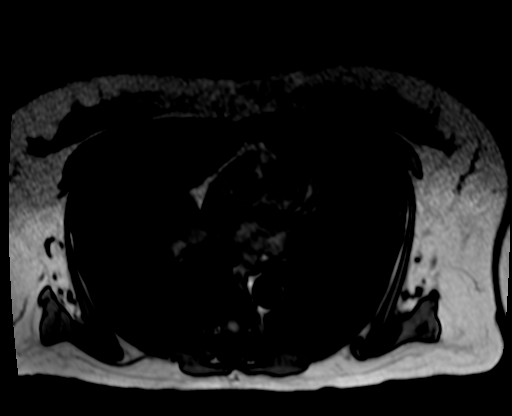

[Series 7: T1 dynamic fat-sat · axial · non-contrast · 3.0mm · 1.19mm/px · z∈[+79,+316]mm · 8 of 80 slices shown]
[im 1/80]
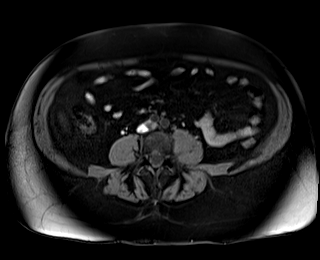
[im 12/80]
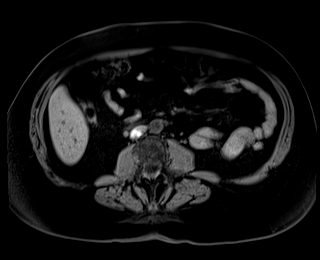
[im 23/80]
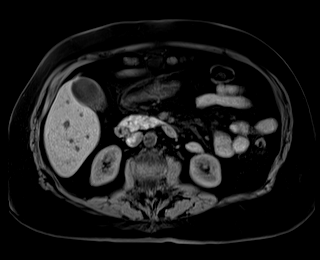
[im 34/80]
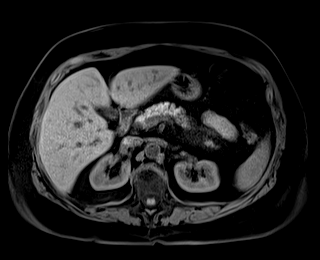
[im 46/80]
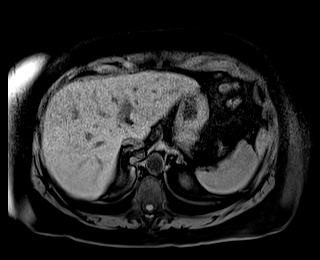
[im 57/80]
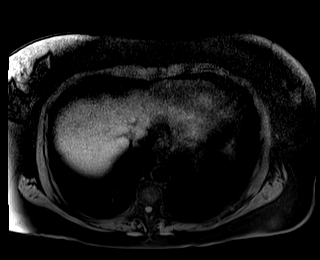
[im 68/80]
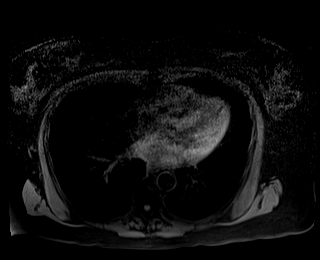
[im 80/80]
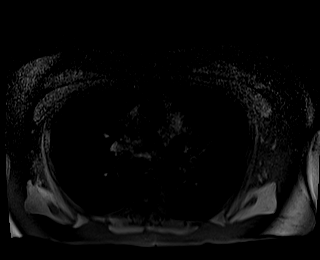

[Series 10: ax dwi_tracew · axial · 6.0mm · 1.42mm/px · z∈[+93,+302]mm · 8 of 90 slices shown]
[im 1/90]
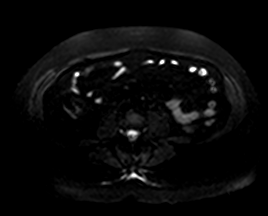
[im 12/90]
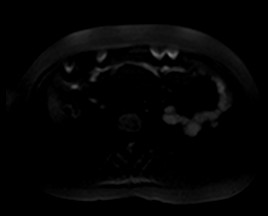
[im 23/90]
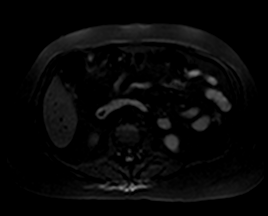
[im 34/90]
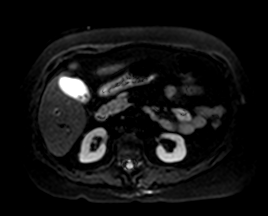
[im 56/90]
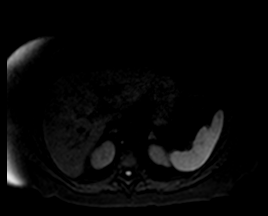
[im 67/90]
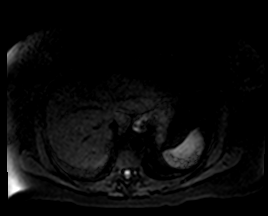
[im 78/90]
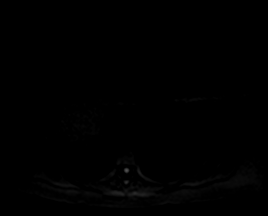
[im 90/90]
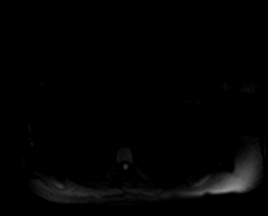

[Series 11: ax dwi_adc · axial · 6.0mm · 1.42mm/px · z∈[+93,+302]mm · 3 of 30 slices shown]
[im 1/30]
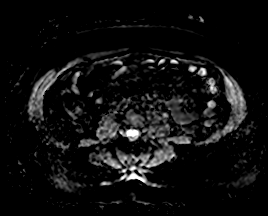
[im 15/30]
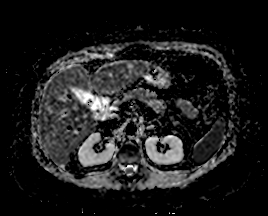
[im 30/30]
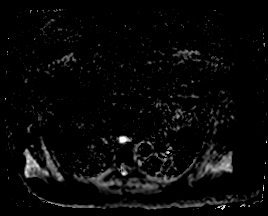

[Series 13: t2_space_cor_cs20_trig_384_iso · coronal · 1.0mm · 0.49mm/px · 3 of 80 slices shown]
[im 1/80]
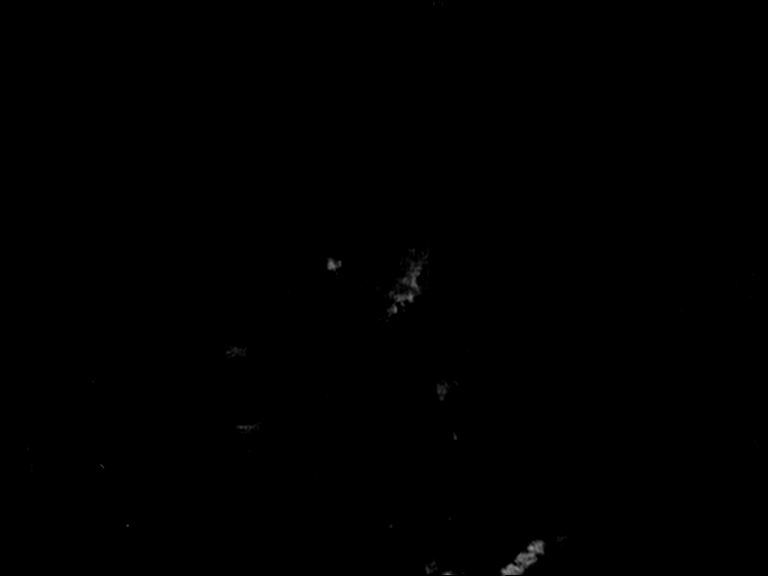
[im 12/80]
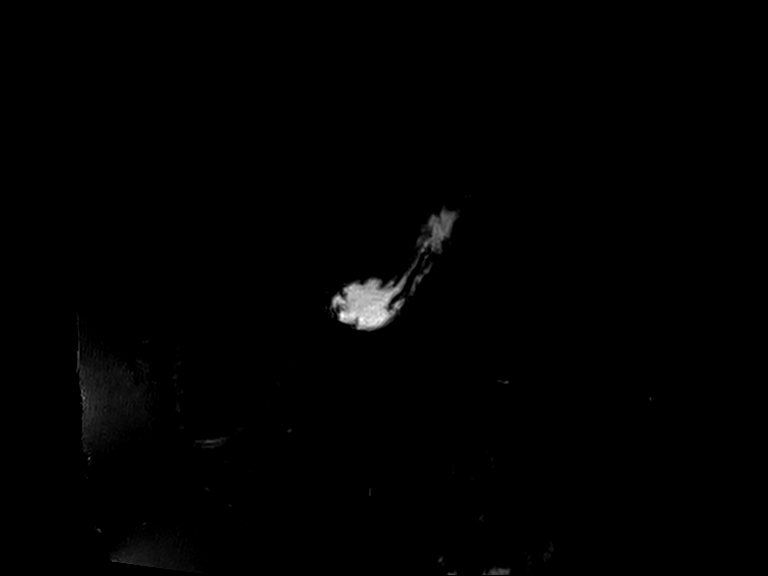
[im 23/80]
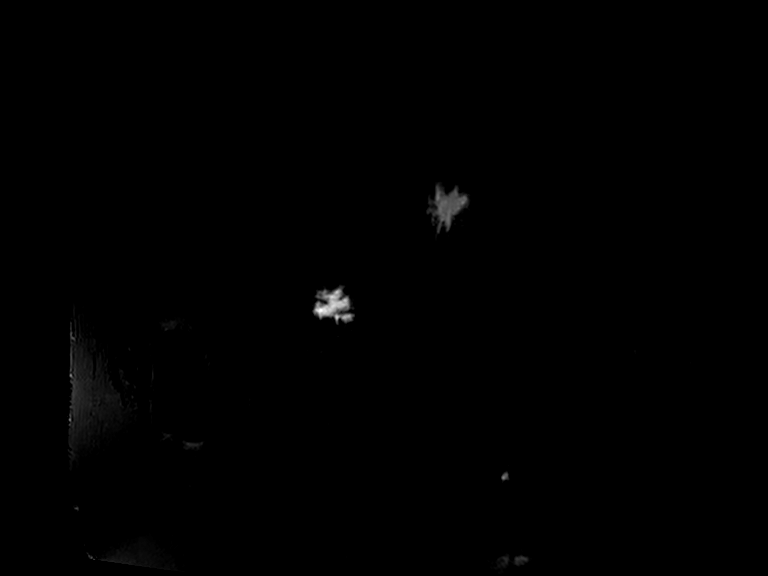

[33 of 48 positions shown; findings below may reference images not displayed]

FINDINGS: Comment: Today's study is limited for detection and characterization
of visceral and/or vascular lesions by lack of IV gadolinium.

Lower chest: Unremarkable.

Hepatobiliary: 8 mm T1 hypointense, T2 hyperintense lesion in
segment 3 of the liver, incompletely characterized on today's
noncontrast examination, but statistically likely a small cyst. No
other larger more suspicious appearing hepatic lesions. No intra or
extrahepatic biliary ductal dilatation noted on MRCP images. Common
bile duct measures 5 mm in the porta hepatis. No filling defect in
the common bile duct to suggest choledocholithiasis. There multiple
filling defects in the gallbladder, compatible with gallstones,
measuring up to 1.4 cm. Gallbladder is not distended. Gallbladder
wall thickness is normal. No pericholecystic fluid.

Pancreas: No pancreatic mass confidently identified on today's
noncontrast examination. No pancreatic ductal dilatation noted on
MRCP images. No pancreatic or peripancreatic fluid collections or
inflammatory changes.

Spleen:  Unremarkable.

Adrenals/Urinary Tract: Bilateral kidneys and adrenal glands are
normal in appearance. No hydroureteronephrosis in the visualized
portions of the abdomen.

Stomach/Bowel: Stomach and visualized portions of small bowel and
colon are unremarkable in appearance.

Vascular/Lymphatic: No aneurysm identified in the visualized
portions of the abdomen. No lymphadenopathy noted in the abdomen.

Other: No significant volume of ascites noted in the visualized
portions of the peritoneal cavity.

Musculoskeletal: No aggressive appearing osseous lesions are noted
in the visualized portions of the skeleton.
IMPRESSION: 1. No acute findings are noted in the abdomen to account for the
patient's symptoms on today's noncontrast examination.
2. Cholelithiasis without evidence of acute cholecystitis.
3. No evidence of choledocholithiasis or biliary tract obstruction.
